# Patient Record
Sex: Female | Born: 2000
Health system: Southern US, Community
[De-identification: ages and names within clinical notes are randomized; demographics above are authoritative.]

## PROBLEM LIST (undated history)

## (undated) HISTORY — PX: MANDIBLE SURGERY: SHX707

---
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
## 2019-10-23 ENCOUNTER — Ambulatory Visit: Payer: BLUE CROSS/BLUE SHIELD | Admitting: Podiatry

## 2019-11-20 DIAGNOSIS — Z5321 Procedure and treatment not carried out due to patient leaving prior to being seen by health care provider: Secondary | ICD-10-CM | POA: Diagnosis not present

## 2019-11-20 DIAGNOSIS — Z041 Encounter for examination and observation following transport accident: Secondary | ICD-10-CM | POA: Diagnosis not present

## 2020-05-17 ENCOUNTER — Encounter (HOSPITAL_COMMUNITY): Payer: Self-pay | Admitting: Emergency Medicine

## 2020-05-17 ENCOUNTER — Other Ambulatory Visit: Payer: Self-pay

## 2020-05-17 ENCOUNTER — Emergency Department (HOSPITAL_COMMUNITY)
Admission: EM | Admit: 2020-05-17 | Discharge: 2020-05-18 | Disposition: A | Discharge status: Home / Self Care | Payer: 59 | Attending: Emergency Medicine | Admitting: Emergency Medicine

## 2020-05-17 DIAGNOSIS — R300 Dysuria: Secondary | ICD-10-CM | POA: Insufficient documentation

## 2020-05-17 DIAGNOSIS — Z5321 Procedure and treatment not carried out due to patient leaving prior to being seen by health care provider: Secondary | ICD-10-CM | POA: Insufficient documentation

## 2020-05-17 DIAGNOSIS — N939 Abnormal uterine and vaginal bleeding, unspecified: Secondary | ICD-10-CM | POA: Diagnosis not present

## 2020-05-17 LAB — CBC WITH DIFFERENTIAL/PLATELET
Abs Immature Granulocytes: 0.03 10*3/uL (ref 0.00–0.07)
Basophils Absolute: 0.1 10*3/uL (ref 0.0–0.1)
Basophils Relative: 1 %
Eosinophils Absolute: 0.1 10*3/uL (ref 0.0–0.5)
Eosinophils Relative: 2 %
HCT: 38.2 % (ref 36.0–46.0)
Hemoglobin: 13.5 g/dL (ref 12.0–15.0)
Immature Granulocytes: 0 %
Lymphocytes Relative: 32 %
Lymphs Abs: 2.7 10*3/uL (ref 0.7–4.0)
MCH: 34 pg (ref 26.0–34.0)
MCHC: 35.3 g/dL (ref 30.0–36.0)
MCV: 96.2 fL (ref 80.0–100.0)
Monocytes Absolute: 0.6 10*3/uL (ref 0.1–1.0)
Monocytes Relative: 7 %
Neutro Abs: 4.7 10*3/uL (ref 1.7–7.7)
Neutrophils Relative %: 58 %
Platelets: 219 10*3/uL (ref 150–400)
RBC: 3.97 MIL/uL (ref 3.87–5.11)
RDW: 12.1 % (ref 11.5–15.5)
WBC: 8.2 10*3/uL (ref 4.0–10.5)
nRBC: 0 % (ref 0.0–0.2)

## 2020-05-17 LAB — URINALYSIS, ROUTINE W REFLEX MICROSCOPIC
Bilirubin Urine: NEGATIVE
Glucose, UA: NEGATIVE mg/dL
Ketones, ur: NEGATIVE mg/dL
Nitrite: NEGATIVE
Protein, ur: 100 mg/dL — AB
RBC / HPF: >50 RBC/hpf — ABNORMAL HIGH (ref 0–5)
Specific Gravity, Urine: 1.03 (ref 1.005–1.030)
WBC, UA: >50 WBC/hpf — ABNORMAL HIGH (ref 0–5)
pH: 8 (ref 5.0–8.0)

## 2020-05-17 LAB — I-STAT BETA HCG BLOOD, ED (MC, WL, AP ONLY): I-stat hCG, quantitative: <5 m[IU]/mL (ref <5)

## 2020-05-17 LAB — BASIC METABOLIC PANEL
Anion gap: 11 (ref 5–15)
BUN: 8 mg/dL (ref 6–20)
CO2: 25 mmol/L (ref 22–32)
Calcium: 9.1 mg/dL (ref 8.9–10.3)
Chloride: 104 mmol/L (ref 98–111)
Creatinine, Ser: 0.84 mg/dL (ref 0.44–1.00)
GFR, Estimated: >60 mL/min (ref >60)
Glucose, Bld: 93 mg/dL (ref 70–99)
Potassium: 3.9 mmol/L (ref 3.5–5.1)
Sodium: 140 mmol/L (ref 135–145)

## 2020-05-17 NOTE — ED Triage Notes (Signed)
Patient reports dysuria with concentrated/malodorous urine for several days / vaginal bleeding today .

## 2020-05-18 NOTE — ED Notes (Signed)
Patient states her parents have to work in the morning so she has to leave

## 2020-06-14 NOTE — L&D Delivery Note (Signed)
OB/GYN Faculty Practice Delivery Note  Michelle Stewart is a 20 y.o. G1P0000 s/p SVD at [redacted]w[redacted]d. She was admitted for PROM.   ROM: 20h 68m with clear fluid GBS Status: Negative Maximum Maternal Temperature: 98.3  Labor Progress: Patient PROMed at home at 1600, then presented to MAU. Was in MAU for extended period of time due to L&D bed shortage. Once up on L&D her labor was induced with a foley bulb and cytotec and then ultimately when the foley bulb came out she received pitocin and progressed to complete  Delivery Date/Time: 1240 on 05/16/2021 Delivery: Called to room and patient was complete and pushing. Head delivered LOA. No nuchal cord present. Shoulder and body delivered in usual fashion. Infant with spontaneous cry, placed on mother's abdomen, dried and stimulated. Cord clamped x 2 after 1-minute delay, and cut by father of baby. Cord blood drawn. Placenta delivered spontaneously with gentle cord traction. Fundus firm with massage and Pitocin. Labia, perineum, vagina, and cervix inspected and found to have bilateral periurethral lacerations that were repaired with 4-0 Monocryl.  Placenta: Intact, 3V cord, L&D Complications: None Lacerations: bilateral periurethral EBL: 50cc Analgesia: Epidural   Infant: female  APGARs 9,9  weight pending  Warner Mccreedy, MD, MPH OB Fellow, Faculty [facility] Center for [facility], [facility] 05/16/2021, 1:09 PM

## 2020-06-20 ENCOUNTER — Encounter (HOSPITAL_COMMUNITY): Payer: Self-pay | Admitting: Emergency Medicine

## 2020-06-20 ENCOUNTER — Ambulatory Visit (HOSPITAL_COMMUNITY)
Admission: EM | Admit: 2020-06-20 | Discharge: 2020-06-20 | Disposition: A | Discharge status: Home / Self Care | Payer: BC Managed Care – PPO | Attending: Physician Assistant | Admitting: Physician Assistant

## 2020-06-20 ENCOUNTER — Other Ambulatory Visit: Payer: Self-pay

## 2020-06-20 DIAGNOSIS — J069 Acute upper respiratory infection, unspecified: Secondary | ICD-10-CM

## 2020-06-20 DIAGNOSIS — U071 COVID-19: Secondary | ICD-10-CM | POA: Insufficient documentation

## 2020-06-20 LAB — SARS CORONAVIRUS 2 (TAT 6-24 HRS): SARS Coronavirus 2: POSITIVE — AB

## 2020-06-20 NOTE — ED Provider Notes (Signed)
[facility]    CSN: [id number] Arrival date & time: 06/20/20  0240      History   Chief Complaint Chief Complaint  Patient presents with  . Covid Exposure  . Cough  . Nasal Congestion  . Headache    HPI Michelle Stewart is a 20 y.o. female.   The history is provided by the patient. No language interpreter was used.  Cough Cough characteristics:  Non-productive Sputum characteristics:  Nondescript Severity:  Moderate Timing:  Constant Progression:  Worsening Chronicity:  New Associated symptoms: headaches   Headache Associated symptoms: cough     History reviewed. No pertinent past medical history.  Patient Active Problem List   Diagnosis Date Noted  . Adjustment disorder with mixed disturbance of emotions and conduct 04/20/2018    History reviewed. No pertinent surgical history.  OB History   No obstetric history on file.      Home Medications    Prior to Admission medications   Medication Sig Start Date End Date Taking? Authorizing Provider  medroxyPROGESTERone (DEPO-PROVERA) 150 MG/ML injection Inject 150 mg into the muscle every 3 (three) months.    [provider]    Family History History reviewed. No pertinent family history.  Social History Social History   Tobacco Use  . Smoking status: Never Smoker  . Smokeless tobacco: Never Used  Substance Use Topics  . Alcohol use: Never  . Drug use: Never     Allergies   Albolene and Lactose   Review of Systems Review of Systems  Respiratory: Positive for cough.   Neurological: Positive for headaches.  All other systems reviewed and are negative.    Physical Exam Triage Vital Signs ED Triage Vitals  Enc Vitals Group     BP 06/20/20 0842 109/75     Pulse Rate 06/20/20 0842 81     Resp 06/20/20 0842 17     Temp 06/20/20 0842 97.6 F (36.4 C)     Temp Source 06/20/20 0842 Oral     SpO2 06/20/20 0842 100 %     Weight --      Height --      Head Circumference --       Peak Flow --      Pain Score 06/20/20 0839 0     Pain Loc --      Pain Edu? --      Excl. in GC? --    No data found.  Updated Vital Signs BP 109/75 (BP Location: Left Arm)   Pulse 81   Temp 97.6 F (36.4 C) (Oral)   Resp 17   SpO2 100%   Visual Acuity Right Eye Distance:   Left Eye Distance:   Bilateral Distance:    Right Eye Near:   Left Eye Near:    Bilateral Near:     Physical Exam Vitals and nursing note reviewed.  Constitutional:      Appearance: She is well-developed and well-nourished.  HENT:     Head: Normocephalic.  Eyes:     Extraocular Movements: EOM normal.  Pulmonary:     Effort: Pulmonary effort is normal.  Abdominal:     General: There is no distension.  Musculoskeletal:        General: Normal range of motion.     Cervical back: Normal range of motion.  Neurological:     Mental Status: She is alert and oriented to person, place, and time.  Psychiatric:        Mood  and Affect: Mood and affect and mood normal.      UC Treatments / Results  Labs (all labs ordered are listed, but only abnormal results are displayed) Labs Reviewed  SARS CORONAVIRUS 2 (TAT 6-24 HRS)    EKG   Radiology No results found.  Procedures Procedures (including critical care time)  Medications Ordered in UC Medications - No data to display  Initial Impression / Assessment and Plan / UC Course  I have reviewed the triage vital signs and the nursing notes.  Pertinent labs & imaging results that were available during my care of the patient were reviewed by me and considered in my medical decision making (see chart for details).     Covid pending. Final Clinical Impressions(s) / UC Diagnoses   Final diagnoses:  Upper respiratory tract infection, unspecified type     Discharge Instructions     Return if any problems.    ED Prescriptions    None     PDMP not reviewed this encounter.  An After Visit Summary was printed and given to the  patient.    Elson Areas, [place] 06/20/20 1147

## 2020-06-20 NOTE — Discharge Instructions (Signed)
Return if any problems.

## 2020-07-14 ENCOUNTER — Ambulatory Visit: Payer: 59 | Admitting: Podiatry

## 2020-07-28 ENCOUNTER — Ambulatory Visit: Payer: BC Managed Care – PPO | Admitting: Podiatry

## 2020-09-06 ENCOUNTER — Inpatient Hospital Stay (HOSPITAL_COMMUNITY)
Admission: EM | Admit: 2020-09-06 | Discharge: 2020-09-07 | Disposition: A | Discharge status: Home / Self Care | Payer: BC Managed Care – PPO | Attending: Obstetrics and Gynecology | Admitting: Obstetrics and Gynecology

## 2020-09-06 DIAGNOSIS — R109 Unspecified abdominal pain: Secondary | ICD-10-CM | POA: Insufficient documentation

## 2020-09-06 DIAGNOSIS — O26899 Other specified pregnancy related conditions, unspecified trimester: Secondary | ICD-10-CM

## 2020-09-06 DIAGNOSIS — O26891 Other specified pregnancy related conditions, first trimester: Secondary | ICD-10-CM | POA: Insufficient documentation

## 2020-09-06 DIAGNOSIS — Z3A01 Less than 8 weeks gestation of pregnancy: Secondary | ICD-10-CM

## 2020-09-06 DIAGNOSIS — O3680X0 Pregnancy with inconclusive fetal viability, not applicable or unspecified: Secondary | ICD-10-CM

## 2020-09-07 ENCOUNTER — Inpatient Hospital Stay (HOSPITAL_COMMUNITY): Payer: BC Managed Care – PPO

## 2020-09-07 ENCOUNTER — Encounter (HOSPITAL_COMMUNITY): Payer: Self-pay

## 2020-09-07 ENCOUNTER — Other Ambulatory Visit: Payer: Self-pay

## 2020-09-07 DIAGNOSIS — Z3201 Encounter for pregnancy test, result positive: Secondary | ICD-10-CM | POA: Diagnosis not present

## 2020-09-07 DIAGNOSIS — Z3A00 Weeks of gestation of pregnancy not specified: Secondary | ICD-10-CM | POA: Diagnosis not present

## 2020-09-07 DIAGNOSIS — N858 Other specified noninflammatory disorders of uterus: Secondary | ICD-10-CM | POA: Diagnosis not present

## 2020-09-07 DIAGNOSIS — O26891 Other specified pregnancy related conditions, first trimester: Secondary | ICD-10-CM | POA: Diagnosis not present

## 2020-09-07 DIAGNOSIS — R109 Unspecified abdominal pain: Secondary | ICD-10-CM | POA: Diagnosis not present

## 2020-09-07 DIAGNOSIS — Z3A01 Less than 8 weeks gestation of pregnancy: Secondary | ICD-10-CM | POA: Diagnosis not present

## 2020-09-07 LAB — COMPREHENSIVE METABOLIC PANEL
ALT: 17 U/L (ref 0–44)
AST: 22 U/L (ref 15–41)
Albumin: 4.2 g/dL (ref 3.5–5.0)
Alkaline Phosphatase: 54 U/L (ref 38–126)
Anion gap: 4 — ABNORMAL LOW (ref 5–15)
BUN: 16 mg/dL (ref 6–20)
CO2: 26 mmol/L (ref 22–32)
Calcium: 9.3 mg/dL (ref 8.9–10.3)
Chloride: 105 mmol/L (ref 98–111)
Creatinine, Ser: 0.75 mg/dL (ref 0.44–1.00)
GFR, Estimated: >60 mL/min (ref >60)
Glucose, Bld: 86 mg/dL (ref 70–99)
Potassium: 3.8 mmol/L (ref 3.5–5.1)
Sodium: 135 mmol/L (ref 135–145)
Total Bilirubin: 0.8 mg/dL (ref 0.3–1.2)
Total Protein: 6.9 g/dL (ref 6.5–8.1)

## 2020-09-07 LAB — WET PREP, GENITAL
Sperm: NONE SEEN
Trich, Wet Prep: NONE SEEN
Yeast Wet Prep HPF POC: NONE SEEN

## 2020-09-07 LAB — CBC WITH DIFFERENTIAL/PLATELET
Abs Immature Granulocytes: 0.03 10*3/uL (ref 0.00–0.07)
Basophils Absolute: 0.1 10*3/uL (ref 0.0–0.1)
Basophils Relative: 1 %
Eosinophils Absolute: 0.2 10*3/uL (ref 0.0–0.5)
Eosinophils Relative: 2 %
HCT: 37.9 % (ref 36.0–46.0)
Hemoglobin: 14 g/dL (ref 12.0–15.0)
Immature Granulocytes: 0 %
Lymphocytes Relative: 30 %
Lymphs Abs: 3.3 10*3/uL (ref 0.7–4.0)
MCH: 34.7 pg — ABNORMAL HIGH (ref 26.0–34.0)
MCHC: 36.9 g/dL — ABNORMAL HIGH (ref 30.0–36.0)
MCV: 93.8 fL (ref 80.0–100.0)
Monocytes Absolute: 0.8 10*3/uL (ref 0.1–1.0)
Monocytes Relative: 7 %
Neutro Abs: 6.5 10*3/uL (ref 1.7–7.7)
Neutrophils Relative %: 60 %
Platelets: 238 10*3/uL (ref 150–400)
RBC: 4.04 MIL/uL (ref 3.87–5.11)
RDW: 11.9 % (ref 11.5–15.5)
WBC: 10.9 10*3/uL — ABNORMAL HIGH (ref 4.0–10.5)
nRBC: 0 % (ref 0.0–0.2)

## 2020-09-07 LAB — URINALYSIS, ROUTINE W REFLEX MICROSCOPIC
Bacteria, UA: NONE SEEN
Bilirubin Urine: NEGATIVE
Glucose, UA: NEGATIVE mg/dL
Hgb urine dipstick: NEGATIVE
Ketones, ur: NEGATIVE mg/dL
Nitrite: NEGATIVE
Protein, ur: NEGATIVE mg/dL
Specific Gravity, Urine: 1.02 (ref 1.005–1.030)
pH: 5 (ref 5.0–8.0)

## 2020-09-07 LAB — I-STAT BETA HCG BLOOD, ED (MC, WL, AP ONLY): I-stat hCG, quantitative: 82.6 m[IU]/mL — ABNORMAL HIGH (ref <5)

## 2020-09-07 LAB — ABO/RH: ABO/RH(D): A POS

## 2020-09-07 LAB — POC URINE PREG, ED: Preg Test, Ur: POSITIVE — AB

## 2020-09-07 LAB — HCG, QUANTITATIVE, PREGNANCY: hCG, Beta Chain, Quant, S: 112 m[IU]/mL — ABNORMAL HIGH (ref <5)

## 2020-09-07 MED ORDER — ACETAMINOPHEN 500 MG PO TABS
1000.0000 mg | ORAL_TABLET | Freq: Once | ORAL | Status: AC
  Administered 2020-09-07: 1000 mg via ORAL
  Filled 2020-09-07: qty 2

## 2020-09-07 NOTE — MAU Provider Note (Signed)
History     CSN: [id number]  Arrival date and time: 09/06/20 2352   Event Date/Time   First Provider Initiated Contact with Patient 09/07/20 0208      Chief Complaint  Patient presents with  . Possible Pregnancy  . Abdominal Cramping   HPI  Ms.Michelle Stewart is a 20 y.o. feamle G1P0 @ [redacted]w[redacted]d here in MAU with complaints of lower abdominal cramping that started yesterday.  The pain has not gotten worse. The pain is located in the bottom of her abdomen on both sides. The pain is constant. The pain is like period cramps. She has not tried anything for the pain. No bleeding.   OB History    Gravida  1   Para      Term      Preterm      AB      Living        SAB      IAB      Ectopic      Multiple      Live Births              History reviewed. No pertinent past medical history.  Past Surgical History:  Procedure Laterality Date  . MANDIBLE SURGERY      History reviewed. No pertinent family history.  Social History   Tobacco Use  . Smoking status: Never Smoker  . Smokeless tobacco: Never Used  Vaping Use  . Vaping Use: Never used  Substance Use Topics  . Alcohol use: Never  . Drug use: Never    Allergies:  Allergies  Allergen Reactions  . Albolene Swelling  . Lactose Diarrhea and Other (See Comments)    constipation     Medications Prior to Admission  Medication Sig Dispense Refill Last Dose  . medroxyPROGESTERone (DEPO-PROVERA) 150 MG/ML injection Inject 150 mg into the muscle every 3 (three) months.      Results for orders placed or performed during the hospital encounter of 09/06/20 (from the past 48 hour(s))  POC Urine Pregnancy, ED (not at [facility])     Status: Abnormal   Collection Time: 09/07/20 12:20 AM  Result Value Ref Range   Preg Test, Ur POSITIVE (A) NEGATIVE    Comment:        THE SENSITIVITY OF THIS METHODOLOGY IS >24 mIU/mL   hCG, quantitative, pregnancy     Status: Abnormal   Collection Time: 09/07/20 12:24 AM  Result  Value Ref Range   hCG, Beta Chain, Quant, S 112 (H) <5 mIU/mL    Comment:          GEST. AGE      CONC.  (mIU/mL)   <=1 WEEK        5 - 50     2 WEEKS       50 - 500     3 WEEKS       100 - 10,000     4 WEEKS     1,000 - 30,000     5 WEEKS     3,500 - 115,000   6-8 WEEKS     12,000 - 270,000    12 WEEKS     15,000 - 220,000        FEMALE AND NON-PREGNANT FEMALE:     LESS THAN 5 mIU/mL Performed at [facility], [address], [place], [place] [address]   I-Stat beta hCG blood, ED ([facility], [facility], [facility] only)     Status: Abnormal  Collection Time: 09/07/20 12:42 AM  Result Value Ref Range   I-stat hCG, quantitative 82.6 (H) <5 mIU/mL   Comment 3            Comment:   GEST. AGE      CONC.  (mIU/mL)   <=1 WEEK        5 - 50     2 WEEKS       50 - 500     3 WEEKS       100 - 10,000     4 WEEKS     1,000 - 30,000        FEMALE AND NON-PREGNANT FEMALE:     LESS THAN 5 mIU/mL   CBC with Differential/Platelet     Status: Abnormal   Collection Time: 09/07/20  1:27 AM  Result Value Ref Range   WBC 10.9 (H) 4.0 - 10.5 K/uL   RBC 4.04 3.87 - 5.11 MIL/uL   Hemoglobin 14.0 12.0 - 15.0 g/dL   HCT 27.7 82.4 - 23.5 %   MCV 93.8 80.0 - 100.0 fL   MCH 34.7 (H) 26.0 - 34.0 pg   MCHC 36.9 (H) 30.0 - 36.0 g/dL   RDW 36.1 44.3 - 15.4 %   Platelets 238 150 - 400 K/uL   nRBC 0.0 0.0 - 0.2 %   Neutrophils Relative % 60 %   Neutro Abs 6.5 1.7 - 7.7 K/uL   Lymphocytes Relative 30 %   Lymphs Abs 3.3 0.7 - 4.0 K/uL   Monocytes Relative 7 %   Monocytes Absolute 0.8 0.1 - 1.0 K/uL   Eosinophils Relative 2 %   Eosinophils Absolute 0.2 0.0 - 0.5 K/uL   Basophils Relative 1 %   Basophils Absolute 0.1 0.0 - 0.1 K/uL   Immature Granulocytes 0 %   Abs Immature Granulocytes 0.03 0.00 - 0.07 K/uL    Comment: Performed at [facility], [address] [place], [place] [address]  Comprehensive metabolic panel     Status: Abnormal   Collection Time: 09/07/20  1:27 AM  Result Value Ref Range   Sodium  135 135 - 145 mmol/L   Potassium 3.8 3.5 - 5.1 mmol/L   Chloride 105 98 - 111 mmol/L   CO2 26 22 - 32 mmol/L   Glucose, Bld 86 70 - 99 mg/dL    Comment: Glucose reference range applies only to samples taken after fasting for at least 8 hours.   BUN 16 6 - 20 mg/dL   Creatinine, Ser 6.19 0.44 - 1.00 mg/dL   Calcium 9.3 8.9 - 50.9 mg/dL   Total Protein 6.9 6.5 - 8.1 g/dL   Albumin 4.2 3.5 - 5.0 g/dL   AST 22 15 - 41 U/L   ALT 17 0 - 44 U/L   Alkaline Phosphatase 54 38 - 126 U/L   Total Bilirubin 0.8 0.3 - 1.2 mg/dL   GFR, Estimated >32 >67 mL/min    Comment: (NOTE) Calculated using the CKD-EPI Creatinine Equation (2021)    Anion gap 4 (L) 5 - 15    Comment: Performed at [facility], [address] [place], [place] [address]  ABO/Rh     Status: None   Collection Time: 09/07/20  1:27 AM  Result Value Ref Range   ABO/RH(D) A POS    No rh immune globuloin      NOT A RH IMMUNE GLOBULIN CANDIDATE, PT RH POSITIVE Performed at [facility], [address] [place], [place] [address]   Urinalysis, Routine w reflex microscopic Urine, Clean Catch     Status: Abnormal   Collection Time: 09/07/20  1:42 AM  Result Value Ref Range   Color, Urine YELLOW YELLOW   APPearance CLEAR CLEAR   Specific Gravity, Urine 1.020 1.005 - 1.030   pH 5.0 5.0 - 8.0   Glucose, UA NEGATIVE NEGATIVE mg/dL   Hgb urine dipstick NEGATIVE NEGATIVE   Bilirubin Urine NEGATIVE NEGATIVE   Ketones, ur NEGATIVE NEGATIVE mg/dL   Protein, ur NEGATIVE NEGATIVE mg/dL   Nitrite NEGATIVE NEGATIVE   Leukocytes,Ua TRACE (A) NEGATIVE   WBC, UA 0-5 0 - 5 WBC/hpf   Bacteria, UA NONE SEEN NONE SEEN   Squamous Epithelial / LPF 0-5 0 - 5    Comment: Performed at [facility], [address] [place], [place] [address]    US OB LESS THAN 14 WEEKS WITH OB TRANSVAGINAL  Result Date: 09/07/2020 CLINICAL DATA:  Pelvic cramping and positive pregnancy test EXAM: OBSTETRIC <14 WK [place] AND TRANSVAGINAL OB US  TECHNIQUE: Both transabdominal and transvaginal ultrasound examinations were performed for complete evaluation of the gestation as well as the maternal uterus, adnexal regions, and pelvic cul-de-sac. Transvaginal technique was performed to assess early pregnancy. COMPARISON:  None. FINDINGS: Intrauterine gestational sac: Absent Maternal uterus/adnexae: Mild decidual reaction is noted within the uterus. No gestational sac is seen. Ovaries are within normal limits. Mild free fluid is seen in the pelvis IMPRESSION: Mild free fluid. No evidence of intra or extrauterine gestational sac. Follow-up can be performed as clinically indicated. Electronically Signed   By: Alcide Clever M.D.   On: 09/07/2020 02:18   Review of Systems  Gastrointestinal: Positive for abdominal pain and nausea. Negative for vomiting.  Genitourinary: Negative for dysuria and vaginal bleeding.   Physical Exam   Blood pressure 104/68, pulse 84, temperature 98 F (36.7 C), resp. rate 16, height 5\' 1"  (1.549 m), weight 51.3 kg, last menstrual period 08/02/2020, SpO2 100 %.  Physical Exam Constitutional:      General: She is not in acute distress.    Appearance: Normal appearance. She is not ill-appearing, toxic-appearing or diaphoretic.  HENT:     Head: Normocephalic.  Abdominal:     Tenderness: There is no abdominal tenderness.  Neurological:     Mental Status: She is alert and oriented to person, place, and time.  Psychiatric:        Behavior: Behavior normal.    MAU Course  Procedures  None  MDM  Wet prep & GC HIV, CBC, Hcg, ABO 08/04/2020 OB transvaginal  Wet prep shows clue cells; patient without odor or symptoms. Will not treat at this time.   Assessment and Plan   A:  1. Abdominal pain in pregnancy   2. Pregnancy of unknown anatomic location   3. [redacted] weeks gestation of pregnancy     P:  Discharge home in stable condition  F/u 3/29 @ 1:30 PM at [facility] for follow up quant Ectopic precautions Pelvic rest Return  to MAU if symptoms worsen  Adalene Gulotta, [facility] NP 09/09/2020 8:14 AM

## 2020-09-07 NOTE — ED Provider Notes (Signed)
MSE was initiated and I personally evaluated the patient and placed orders (if any) at  12:43 AM on September 07, 2020.  The patient appears stable so that the remainder of the MSE may be completed by another provider.  Patient presents to the emergency department with lower abdominal cramping pain in the setting of likely early pregnancy.  She has an LMP of February 19 th and has tested positive at home for pregnancy.  Her pregnancy test here is also positive.  Her abdominal pain is cramping and diffusely through her lower abdomen.  No vaginal bleeding or discharge.  She is hemodynamically stable.  This is her first pregnancy.   Discussed the case with the MAU APP on call. They will accept the patient there for evaluation.    Maia Plan, MD 09/07/20 [phone]

## 2020-09-07 NOTE — MAU Note (Signed)
Pt reports lower abdominal cramping that started yesterday.   Denies vaginal bleeding.

## 2020-09-07 NOTE — ED Triage Notes (Signed)
Pt reports that she is 5-[redacted] weeks pregnant. Getting lower abdominal cramping. Denies vaginal bleeding. Cramping started yesterday.

## 2020-09-08 LAB — GC/CHLAMYDIA PROBE AMP (~~LOC~~) NOT AT ARMC
Chlamydia: NEGATIVE
Comment: NEGATIVE
Comment: NORMAL
Neisseria Gonorrhea: NEGATIVE

## 2020-09-09 ENCOUNTER — Other Ambulatory Visit: Payer: BC Managed Care – PPO

## 2020-09-09 ENCOUNTER — Other Ambulatory Visit: Payer: Self-pay

## 2020-09-09 ENCOUNTER — Ambulatory Visit (INDEPENDENT_AMBULATORY_CARE_PROVIDER_SITE_OTHER): Payer: BC Managed Care – PPO | Admitting: Lactation Services

## 2020-09-09 ENCOUNTER — Telehealth: Payer: Self-pay | Admitting: Lactation Services

## 2020-09-09 VITALS — BP 114/74 | HR 94 | Ht 61.0 in | Wt 112.2 lb

## 2020-09-09 DIAGNOSIS — Z3A01 Less than 8 weeks gestation of pregnancy: Secondary | ICD-10-CM | POA: Diagnosis not present

## 2020-09-09 LAB — BETA HCG QUANT (REF LAB): hCG Quant: 451 m[IU]/mL

## 2020-09-09 NOTE — Progress Notes (Addendum)
Patient her for follow up Stat Beta Hcg. LMP 08/02/2020 EDD 05/09/2021. Patient is 5 weeks 3 days.   Patient reports abdominal cramping, centrally located. She is not having any bleeding or abnormal discharge. She reports she is feeling stressed.   She is taking PNV.   Patient was advised that her Hcg would be back in about 2 hours and she will be called with results and recommendations. Patient voiced understanding.   Follow up Hcg 451, reviewed with Dr. Crissie Reese. Patient can start [facility].   Called patient at 3:39 pm. She did not answer. LM for her to call the office at her convenience.

## 2020-09-09 NOTE — Telephone Encounter (Signed)
Called patient and informed her of adequate rise to Hcg and can start [facility]. Reviewed front desk will be calling to schedule her for [facility] at around 10 weeks of pregnancy.   Patient was informed if she has bleeding like a period or severe abdominal pain to go back to MAU for evaluation. Patient voiced understanding.   Patient to call the office with any questions or concerns as needed.

## 2020-09-09 NOTE — Telephone Encounter (Signed)
Patient called and LM for office to call her back.   Returned patients call and she did not answer x 2. LM for her to call the office at her convenience.   Patient needs to be notified that her Hcg rose appropriately to 451 and can start [facility]. Message to front office to call patient and schedule for [facility].

## 2020-09-09 NOTE — Telephone Encounter (Signed)
-----   Message from Ed Blalock, RN sent at 09/09/2020  3:42 PM EDT ----- [employer]- See check out note please and schedule for [facility].

## 2020-09-11 NOTE — Progress Notes (Signed)
Chart reviewed for nurse visit. Agree with plan of care.   Venora Maples, MD 09/11/20 4:52 PM

## 2020-09-13 ENCOUNTER — Inpatient Hospital Stay (HOSPITAL_COMMUNITY)
Admission: AD | Admit: 2020-09-13 | Discharge: 2020-09-13 | Disposition: A | Discharge status: Home / Self Care | Payer: BC Managed Care – PPO | Attending: Obstetrics and Gynecology | Admitting: Obstetrics and Gynecology

## 2020-09-13 ENCOUNTER — Other Ambulatory Visit: Payer: Self-pay

## 2020-09-13 DIAGNOSIS — R1032 Left lower quadrant pain: Secondary | ICD-10-CM | POA: Insufficient documentation

## 2020-09-13 DIAGNOSIS — R1031 Right lower quadrant pain: Secondary | ICD-10-CM | POA: Insufficient documentation

## 2020-09-13 DIAGNOSIS — O26891 Other specified pregnancy related conditions, first trimester: Secondary | ICD-10-CM | POA: Insufficient documentation

## 2020-09-13 DIAGNOSIS — O23591 Infection of other part of genital tract in pregnancy, first trimester: Secondary | ICD-10-CM | POA: Insufficient documentation

## 2020-09-13 DIAGNOSIS — N760 Acute vaginitis: Secondary | ICD-10-CM

## 2020-09-13 DIAGNOSIS — O99891 Other specified diseases and conditions complicating pregnancy: Secondary | ICD-10-CM

## 2020-09-13 DIAGNOSIS — Z3A01 Less than 8 weeks gestation of pregnancy: Secondary | ICD-10-CM | POA: Insufficient documentation

## 2020-09-13 DIAGNOSIS — Z3A00 Weeks of gestation of pregnancy not specified: Secondary | ICD-10-CM | POA: Diagnosis not present

## 2020-09-13 DIAGNOSIS — R1030 Lower abdominal pain, unspecified: Secondary | ICD-10-CM | POA: Diagnosis not present

## 2020-09-13 DIAGNOSIS — B9689 Other specified bacterial agents as the cause of diseases classified elsewhere: Secondary | ICD-10-CM | POA: Insufficient documentation

## 2020-09-13 DIAGNOSIS — Z3687 Encounter for antenatal screening for uncertain dates: Secondary | ICD-10-CM

## 2020-09-13 LAB — HCG, QUANTITATIVE, PREGNANCY: hCG, Beta Chain, Quant, S: 5779 m[IU]/mL — ABNORMAL HIGH (ref <5)

## 2020-09-13 MED ORDER — METRONIDAZOLE 500 MG PO TABS: 500.0000 mg | ORAL_TABLET | Freq: Once | ORAL | Status: DC

## 2020-09-13 NOTE — Discharge Instructions (Signed)
Abdominal Pain During Pregnancy Abdominal pain is common during pregnancy and has many possible causes. Some causes are more serious than others, and sometimes the cause is not known. Abdominal pain can be a sign that labor is starting. It can also be caused by normal growth of your baby causing stretching of muscles and ligaments during pregnancy. Always tell your health care provider if you have any abdominal pain. Follow these instructions at home:  Do not have sex or put anything in your vagina until your pain goes away completely.  Get plenty of rest until your pain improves.  Drink enough fluid to keep your urine pale yellow.  Take over-the-counter and prescription medicines only as told by your health care provider.  Keep all follow-up visits. This is important.   Contact a health care provider if:  Your pain continues or gets worse after resting.  You have lower abdominal pain that: ? Comes and goes at regular intervals. ? Spreads to your back. ? Is similar to menstrual cramps.  You have pain or burning when you urinate. Get help right away if:  You have a fever, chills, or shortness of breath.  You have vaginal bleeding.  You are leaking fluid or passing tissue from your vagina.  You have vomiting or diarrhea that lasts for more than 24 hours.  Your baby is moving less than usual.  You feel very weak or faint.  You develop severe pain in your upper abdomen. Summary  Abdominal pain is common during pregnancy and has many possible causes.  If you experience abdominal pain during pregnancy, tell your health care provider right away.  Follow your health care provider's home care instructions and keep all follow-up visits as told. This information is not intended to replace advice given to you by your health care provider. Make sure you discuss any questions you have with your health care provider. Document Revised: 02/12/2020 Document Reviewed: 02/12/2020 Elsevier  Patient Education  2021 [employer] Inc.  

## 2020-09-13 NOTE — ED Provider Notes (Signed)
MSE was initiated and I personally evaluated the patient and placed orders (if any) at  9:45 PM on September 13, 2020.  The patient appears stable so that the remainder of the MSE may be completed by another provider.  20 year old female G1 P0 approximately [redacted] weeks pregnant presenting complaining of lower abdominal cramping that is been waxing waning for more than a week and forth concerning for potential miscarriage.  Denies any vaginal bleeding or vaginal discharge.  States she has had a formal ultrasound 4 days ago outpatient but confirm IUP.  At this time she is resting comfortably, minimal tenderness to lower abdomen on palpation.  9:49 PM Appreciate consultation with MAU provider who agrees to accept pt for further evaluation.     Fayrene Helper, PA-C 09/13/20 2149    Koleen Distance, MD 09/14/20 [phone]

## 2020-09-13 NOTE — MAU Provider Note (Addendum)
Event Date/Time   First Provider Initiated Contact with Patient 09/13/20 2226      S Ms. Sable Knoles is a 20 y.o. G1P0 patient who presents to MAU today with complaint of abdominal pain. Patient states "it is just a lot of stuff going on and I just want to make sure I am not miscarrying from stress."  Patient denies vaginal bleeding or discharge.  She states the pain is located in lower abdomen and is "in both sides, but mostly my right."  She describes the pain as "a period cramp." She reports the pain is constant and she has not tried any interventions for pain.  She states there are no aggravating or relieving factors.  She endorses that this pain has not changed since her last visit on March 27th.   O BP 124/74 (BP Location: Right Arm)   Pulse 98   Temp 98.9 F (37.2 C)   Resp 17   Ht 5\' 2"  (1.575 m)   Wt 50.9 kg   LMP 08/02/2020   SpO2 100%   BMI 20.52 kg/m   Review of Systems  Constitutional: Negative for chills and fever.  Respiratory: Negative for cough and shortness of breath.   Gastrointestinal: Positive for abdominal pain (Bilaterally-Lower-Cramping), constipation and nausea. Negative for vomiting.  Genitourinary:       No vaginal bleeding or discharge    Physical Exam Vitals reviewed.  Constitutional:      Appearance: She is well-developed.  HENT:     Head: Normocephalic and atraumatic.  Pulmonary:     Effort: Pulmonary effort is normal. No respiratory distress.     Breath sounds: Normal breath sounds.  Abdominal:     General: Abdomen is flat. Bowel sounds are normal.     Palpations: Abdomen is soft.     Tenderness: There is no abdominal tenderness.  Skin:    General: Skin is warm and dry.  Neurological:     Mental Status: She is alert and oriented to person, place, and time.  Psychiatric:        Mood and Affect: Mood is anxious.        Behavior: Behavior normal.    Results for orders placed or performed during the hospital encounter of 09/13/20 (from  the past 24 hour(s))  hCG, quantitative, pregnancy     Status: Abnormal   Collection Time: 09/13/20  9:49 PM  Result Value Ref Range   hCG, Beta Chain, Quant, S 5,779 (H) <5 mIU/mL   11/13/20 OB LESS THAN 14 WEEKS WITH OB TRANSVAGINAL  Result Date: 09/07/2020 CLINICAL DATA:  Pelvic cramping and positive pregnancy test EXAM: OBSTETRIC <14 WK 09/09/2020 AND TRANSVAGINAL OB US TECHNIQUE: Both transabdominal and transvaginal ultrasound examinations were performed for complete evaluation of the gestation as well as the maternal uterus, adnexal regions, and pelvic cul-de-sac. Transvaginal technique was performed to assess early pregnancy. COMPARISON:  None. FINDINGS: Intrauterine gestational sac: Absent Maternal uterus/adnexae: Mild decidual reaction is noted within the uterus. No gestational sac is seen. Ovaries are within normal limits. Mild free fluid is seen in the pelvis IMPRESSION: Mild free fluid. No evidence of intra or extrauterine gestational sac. Follow-up can be performed as clinically indicated. Electronically Signed   By: [place] M.D.   On: 09/07/2020 02:18   A Medical screening exam complete Abdominal Pain-No Change +Clue Cells  P -Informed that hCG is pending and she can wait for results and if appropriate will send for 09/09/2020. -Reassured that in absence of vaginal  bleeding or change in abdominal cramping, everything is likely okay. -Informed that she can receive outpatient [place] for reassurance if desired. -Patient agreeable to discharge and plan to scheduled for outpatient [place] if hCG level is appropriate. -Discussed usage of tylenol for abdominal cramping and discussed round ligament pain/discomfort associated with growing uterus. -Recommended treatment of BV, which patient endorses a history of, to rule out as cause of cramping.  Patient agreeable.  -Rx for metronidazole tablets sent to pharmacy on file.  -Patient encouraged to seek out LCSW at [facility] for support with social issues that are causing  stress. -Bleeding Precautions Given. -Encouraged to call or return to MAU if symptoms worsen or with the onset of new symptoms. -Discharged to home in stable condition.  Gerrit Heck, CNM 09/13/2020 10:26 PM

## 2020-09-13 NOTE — MAU Note (Signed)
Pt reports continued vaginal spotting since last week.  Pt wanted to make sure that the baby is okay.

## 2020-09-26 ENCOUNTER — Encounter (HOSPITAL_COMMUNITY): Payer: Self-pay | Admitting: Obstetrics & Gynecology

## 2020-09-26 ENCOUNTER — Inpatient Hospital Stay (HOSPITAL_COMMUNITY)
Admission: AD | Admit: 2020-09-26 | Discharge: 2020-09-27 | Disposition: A | Discharge status: Home / Self Care | Payer: BC Managed Care – PPO | Attending: Obstetrics & Gynecology | Admitting: Obstetrics & Gynecology

## 2020-09-26 ENCOUNTER — Other Ambulatory Visit: Payer: Self-pay

## 2020-09-26 DIAGNOSIS — O26851 Spotting complicating pregnancy, first trimester: Secondary | ICD-10-CM | POA: Insufficient documentation

## 2020-09-26 DIAGNOSIS — O23591 Infection of other part of genital tract in pregnancy, first trimester: Secondary | ICD-10-CM | POA: Insufficient documentation

## 2020-09-26 DIAGNOSIS — O23599 Infection of other part of genital tract in pregnancy, unspecified trimester: Secondary | ICD-10-CM | POA: Diagnosis not present

## 2020-09-26 DIAGNOSIS — Z3A00 Weeks of gestation of pregnancy not specified: Secondary | ICD-10-CM

## 2020-09-26 DIAGNOSIS — B9689 Other specified bacterial agents as the cause of diseases classified elsewhere: Secondary | ICD-10-CM | POA: Diagnosis not present

## 2020-09-26 DIAGNOSIS — Z3A01 Less than 8 weeks gestation of pregnancy: Secondary | ICD-10-CM | POA: Insufficient documentation

## 2020-09-26 MED ORDER — METRONIDAZOLE 0.75 % VA GEL: 1.0000 | Freq: Every day | VAGINAL | 0 refills | Status: DC

## 2020-09-26 NOTE — Discharge Instructions (Signed)
Metronidazole vaginal gel What is this medicine? METRONIDAZOLE (me troe NI da zole) VAGINAL GEL is an antiinfective. It is used to treat bacterial vaginitis. This medicine may be used for other purposes; ask your health care provider or pharmacist if you have questions. COMMON BRAND NAME(S): MetroGel, MetroGel Vaginal, MetroGel-Vaginal, NUVESSA, Vandazole What should I tell my health care provider before I take this medicine? They need to know if you have any of these conditions:  Cockayne syndrome  history of blood diseases, like sickle cell disease or leukemia  history of yeast infection  if you often drink alcohol  liver disease  an unusual or allergic reaction to metronidazole, nitroimidazoles, parabens, or other medicines, foods, dyes, or preservatives  pregnant or trying to get pregnant  breast-feeding How should I use this medicine? This medicine is only for use in the vagina. Do not take by mouth or apply to other areas of the body. Follow the directions on the prescription label. Wash hands before and after use. Screw the applicator to the tube and squeeze the tube gently to fill the applicator. Lie on your back, part and bend your knees. Insert the applicator tip high in the vagina and push the plunger to release the gel into the vagina. Gently remove the applicator. Wash the applicator well with warm water and soap. Use at regular intervals. Finish the full course prescribed by your doctor or health care professional even if you think your condition is better. Do not stop using except on the advice of your doctor or health care professional. Talk to your pediatrician regarding the use of this medicine in children. While this drug maybe prescribed for children as young as 12 years for selected conditions, precautions do apply. Overdosage: If you think you have taken too much of this medicine contact a poison control center or emergency room at once. NOTE: This medicine is only  for you. Do not share this medicine with others. What if I miss a dose? If you miss a dose, use it as soon as you can. If it is almost time for your next dose, use only that dose. Do not use double or extra doses. What may interact with this medicine? Do not take this medicine with any of the following medications:  alcohol or any product that contains alcohol  cisapride  disulfiram  dronedarone  pimozide  thioridazine This medicine may also interact with the following medications:  amiodarone  birth control pills  busulfan  carbamazepine  cimetidine  cyclosporine  fluorouracil  lithium  other medicines that prolong the QT interval (cause an abnormal heart rhythm) like dofetilide, ziprasidone  phenobarbital  phenytoin  quinidine  tacrolimus  vecuronium  warfarin This list may not describe all possible interactions. Give your health care provider a list of all the medicines, herbs, non-prescription drugs, or dietary supplements you use. Also tell them if you smoke, drink alcohol, or use illegal drugs. Some items may interact with your medicine. What should I watch for while using this medicine? Tell your doctor or health care professional if your symptoms do not improve or if they get worse. You may get drowsy or dizzy. Do not drive, use machinery, or do anything that needs mental alertness until you know how this medicine affects you. Do not stand or sit up quickly, especially if you are an older patient. This reduces the risk of dizzy or fainting spells. Ask your doctor or health care professional if you should avoid alcohol. Many nonprescription cough and  cold products contain alcohol. Metronidazole can cause an unpleasant reaction when taken with alcohol. The reaction includes flushing, headache, nausea, vomiting, sweating, and increased thirst. The reaction can last from 30 minutes to several hours. If you are being treated for a sexually transmitted disease,  avoid sexual contact until you have finished your treatment. Your sexual partner may also need treatment. What side effects may I notice from receiving this medicine? Side effects that you should report to your doctor or health care professional as soon as possible:  allergic reactions like skin rash, itching or hives, swelling of the face, lips, or tongue  confusion  fast, irregular heartbeat  fever, chills, sore throat  fever with rash, swollen lymph nodes, or swelling of the face  pain, tingling, numbness in the hands or feet  redness, blistering, peeling or loosening of the skin, including inside the mouth  seizures  sign and symptoms of liver injury like dark yellow or brown urine; general ill feeling or flu-like symptoms; light colored stools; loss of appetite; nausea; right upper belly pain; unusually weak or tired; yellowing of the eyes or skin  vaginal discharge, itching, or odor in women Side effects that usually do not require medical attention (report to your doctor or health care professional if they continue or are bothersome):  changes in taste  diarrhea  headache  nausea, vomiting  stomach pain This list may not describe all possible side effects. Call your doctor for medical advice about side effects. You may report side effects to FDA at 1-800-FDA-1088. Where should I keep my medicine? Keep out of the reach of children. Store at room temperature between 15 and 30 degrees C (59 and 86 degrees F). Do not freeze. Throw away any unused medicine after the expiration date. NOTE: This sheet is a summary. It may not cover all possible information. If you have questions about this medicine, talk to your doctor, pharmacist, or health care provider.  2021 Elsevier/Gold Standard (2018-05-23 06:53:27) Bacterial Vaginosis  Bacterial vaginosis is an infection that occurs when the normal balance of bacteria in the vagina changes. This change is caused by an overgrowth of  certain bacteria in the vagina. Bacterial vaginosis is the most common vaginal infection among females aged 9 to 65 years. This condition increases the risk of sexually transmitted infections (STIs). Treatment can help reduce this risk. Treatment is very important for pregnant women because this condition can cause babies to be born early (prematurely) or at a low birth weight. What are the causes? This condition is caused by an increase in harmful bacteria that are normally present in small amounts in the vagina. However, the exact reason this condition develops is not known. You cannot get bacterial vaginosis from toilet seats, bedding, swimming pools, or contact with objects around you. What increases the risk? The following factors may make you more likely to develop this condition:  Having a new sexual partner or multiple sexual partners, or having unprotected sex.  Douching.  Having an intrauterine device (IUD).  Smoking.  Abusing drugs and alcohol. This may lead to riskier sexual behavior.  Taking certain antibiotic medicines.  Being pregnant. What are the signs or symptoms? Some women with this condition have no symptoms. Symptoms may include:  Wallace Cullens or white vaginal discharge. The discharge can be watery or foamy.  A fish-like odor with discharge, especially after sex or during menstruation.  Itching in and around the vagina.  Burning or pain with urination. How is this diagnosed? This condition  is diagnosed based on:  Your medical history.  A physical exam of the vagina.  Checking a sample of vaginal fluid for harmful bacteria or abnormal cells. How is this treated? This condition is treated with antibiotic medicines. These may be given as a pill, a vaginal cream, or a medicine that is put into the vagina (suppository). If the condition comes back after treatment, a second round of antibiotics may be needed. Follow these instructions at home: Medicines  Take or  apply over-the-counter and prescription medicines only as told by your health care provider.  Take or apply your antibiotic medicine as told by your health care provider. Do not stop using the antibiotic even if you start to feel better. General instructions  If you have a female sexual partner, tell her that you have a vaginal infection. She should follow up with her health care provider. If you have a female sexual partner, he does not need treatment.  Avoid sexual activity until you finish treatment.  Drink enough fluid to keep your urine pale yellow.  Keep the area around your vagina and rectum clean. ? Wash the area daily with warm water. ? Wipe yourself from front to back after using the toilet.  If you are breastfeeding, talk to your health care provider about continuing breastfeeding during treatment.  Keep all follow-up visits. This is important. How is this prevented? Self-care  Do not douche.  Wash the outside of your vagina with warm water only.  Wear cotton or cotton-lined underwear.  Avoid wearing tight pants and pantyhose, especially during the summer. Safe sex  Use protection when having sex. This includes: ? Using condoms. ? Using dental dams. This is a thin layer of a material made of latex or polyurethane that protects the mouth during oral sex.  Limit the number of sexual partners. To help prevent bacterial vaginosis, it is best to have sex with just one partner (monogamous relationship).  Make sure you and your sexual partner are tested for STIs. Drugs and alcohol  Do not use any products that contain nicotine or tobacco. These products include cigarettes, chewing tobacco, and vaping devices, such as e-cigarettes. If you need help quitting, ask your health care provider.  Do not use drugs.  Do not drink alcohol if: ? Your health care provider tells you not to do this. ? You are pregnant, may be pregnant, or are planning to become pregnant.  If you  drink alcohol: ? Limit how much you have to 0-1 drink a day. ? Be aware of how much alcohol is in your drink. In the [place] one drink equals one 12 oz bottle of beer (355 mL), one 5 oz glass of wine (148 mL), or one 1 oz glass of hard liquor (44 mL). Where to find more information  Centers for Disease Control and Prevention: FootballExhibition.com.br  American Sexual Health Association (ASHA): www.ashastd.org  [place].S[place] Department of Health and Health and safety inspector, Office on Women's Health: http://hoffman.com/ Contact a health care provider if:  Your symptoms do not improve, even after treatment.  You have more discharge or pain when urinating.  You have a fever or chills.  You have pain in your abdomen or pelvis.  You have pain during sex.  You have vaginal bleeding between menstrual periods. Summary  Bacterial vaginosis is a vaginal infection that occurs when the normal balance of bacteria in the vagina changes. It results from an overgrowth of certain bacteria.  This condition increases the risk of sexually transmitted infections (  STIs). Getting treated can help reduce this risk.  Treatment is very important for pregnant women because this condition can cause babies to be born early (prematurely) or at low birth weight.  This condition is treated with antibiotic medicines. These may be given as a pill, a vaginal cream, or a medicine that is put into the vagina (suppository). This information is not intended to replace advice given to you by your health care provider. Make sure you discuss any questions you have with your health care provider. Document Revised: 11/29/2019 Document Reviewed: 11/29/2019 Elsevier Patient Education  2021 [employer].

## 2020-09-26 NOTE — MAU Provider Note (Signed)
None     S [name] is a 20 y.o. G1P0 patient who presents to MAU today with complaint of vaginal spotting. She states she notes some light spotting with wiping and contributes it to her untreated BV infection.  She reports some occasional cramping, but none currently.  Patient declines vaginal exam.   O BP 109/70   Pulse 83   Temp 98.2 F (36.8 C)   Resp 16   Ht 5\' 1"  (1.549 m)   Wt 51.9 kg   LMP 08/02/2020   SpO2 99%   BMI 21.63 kg/m  Physical Exam Constitutional:      Appearance: She is well-developed.  HENT:     Head: Normocephalic and atraumatic.  Cardiovascular:     Rate and Rhythm: Normal rate and regular rhythm.  Pulmonary:     Effort: Pulmonary effort is normal.     Breath sounds: Normal breath sounds.  Abdominal:     General: Abdomen is flat.     Palpations: Abdomen is soft.     Tenderness: There is no abdominal tenderness.  Neurological:     Mental Status: She is alert and oriented to person, place, and time.  Psychiatric:        Mood and Affect: Mood normal.        Behavior: Behavior normal.     A Medical screening exam complete Vaginal Spotting Known BV Infection  P Discussed treatment options and patient desires metrogel. Instructed to monitor symptoms and report any worsening. Rx sent to pharmacy on file. Patient instructed to keep 08/04/2020 appt as scheduled. Patient without questions or concerns. Discharge from MAU in stable condition  [place], Gerrit Heck 09/26/2020 11:41 PM

## 2020-09-26 NOTE — MAU Note (Signed)
PT SAYS SHE WAS HERE 2 WEEKS AGO- DX- WITH BV - NO MEDS WAS SENT TO PHARMACY.  THINKS ITS WORSE.  HAS BEEN CRAMPING - BECAME WORSE Tuesday. STARTED VB TODAY WHEN SHE WIPES .

## 2020-09-27 DIAGNOSIS — Z3A00 Weeks of gestation of pregnancy not specified: Secondary | ICD-10-CM | POA: Diagnosis not present

## 2020-09-27 DIAGNOSIS — Z3A01 Less than 8 weeks gestation of pregnancy: Secondary | ICD-10-CM | POA: Diagnosis not present

## 2020-09-27 DIAGNOSIS — O23599 Infection of other part of genital tract in pregnancy, unspecified trimester: Secondary | ICD-10-CM | POA: Diagnosis not present

## 2020-09-27 DIAGNOSIS — O26851 Spotting complicating pregnancy, first trimester: Secondary | ICD-10-CM | POA: Diagnosis not present

## 2020-09-27 DIAGNOSIS — B9689 Other specified bacterial agents as the cause of diseases classified elsewhere: Secondary | ICD-10-CM | POA: Diagnosis not present

## 2020-09-27 DIAGNOSIS — O23591 Infection of other part of genital tract in pregnancy, first trimester: Secondary | ICD-10-CM | POA: Diagnosis not present

## 2020-10-01 ENCOUNTER — Ambulatory Visit
Admission: RE | Admit: 2020-10-01 | Discharge: 2020-10-01 | Disposition: A | Discharge status: Home / Self Care | Payer: BC Managed Care – PPO | Source: Ambulatory Visit

## 2020-10-01 ENCOUNTER — Other Ambulatory Visit: Payer: Self-pay

## 2020-10-01 ENCOUNTER — Telehealth: Payer: Self-pay | Admitting: Medical

## 2020-10-01 DIAGNOSIS — Z3687 Encounter for antenatal screening for uncertain dates: Secondary | ICD-10-CM | POA: Insufficient documentation

## 2020-10-01 DIAGNOSIS — O208 Other hemorrhage in early pregnancy: Secondary | ICD-10-CM | POA: Diagnosis not present

## 2020-10-01 DIAGNOSIS — Z3A01 Less than 8 weeks gestation of pregnancy: Secondary | ICD-10-CM | POA: Diagnosis not present

## 2020-10-01 NOTE — Telephone Encounter (Signed)
I called Michelle Stewart today at 12:12 PM and confirmed patient's identity using two patient identifiers. [place] results from earlier today were reviewed. Patient is scheduled for new OB visit at [facility] on 10/08/20. First trimester warning signs reviewed. Patient voiced understanding and had no further questions.   US OB Transvaginal  Result Date: 10/01/2020 CLINICAL DATA:  Uncertain LMP, first trimester pregnancy; LMP 08/02/2020 EXAM: TRANSVAGINAL OB ULTRASOUND TECHNIQUE: Transvaginal ultrasound was performed for complete evaluation of the gestation as well as the maternal uterus, adnexal regions, and pelvic cul-de-sac. COMPARISON:  09/07/2020 FINDINGS: Intrauterine gestational sac: Present, single Yolk sac:  Present Embryo:  Present Cardiac Activity: Present Heart Rate: 148 bpm CRL:   12.3 mm   7 w 3 d                  [place] EDC: 05/17/2021 Subchorionic hemorrhage: Questionable tiny subchronic hemorrhage image 18 Maternal uterus/adnexae: Remainder of uterus unremarkable. Small corpus luteum RIGHT ovary. Ovaries otherwise normal appearance. Trace free pelvic fluid, nonspecific. No adnexal masses. IMPRESSION: Single live intrauterine gestation at 7 weeks 3 days EGA by crown-rump length. Questionable tiny subchronic hemorrhage. Electronically Signed   By: Ulyses Southward M.D.   On: 10/01/2020 11:49    Marny Lowenstein, PA-C 10/01/2020 12:12 PM

## 2020-10-02 ENCOUNTER — Telehealth: Payer: Self-pay | Admitting: General Practice

## 2020-10-02 NOTE — Telephone Encounter (Signed)
Patient called into front office with questions about her ultrasound. Patient states she is confused about when her due date is and how far along she is because the ultrasound says one thing & her period says another. Reviewed with patient ultrasound shows an 8 day discrepancy between her period thus her ultrasound is the better indicator of how far along she is. Reviewed due date of 05/17/21 based off ultrasound making her [redacted]w[redacted]d. Patient verbalized understanding & confirmed she is taking PNV. Patient had no other questions.

## 2020-10-08 ENCOUNTER — Telehealth (INDEPENDENT_AMBULATORY_CARE_PROVIDER_SITE_OTHER): Payer: BC Managed Care – PPO

## 2020-10-08 DIAGNOSIS — Z3400 Encounter for supervision of normal first pregnancy, unspecified trimester: Secondary | ICD-10-CM

## 2020-10-08 DIAGNOSIS — Z3490 Encounter for supervision of normal pregnancy, unspecified, unspecified trimester: Secondary | ICD-10-CM | POA: Insufficient documentation

## 2020-10-08 MED ORDER — PROMETHAZINE HCL 25 MG PO TABS: 25.0000 mg | ORAL_TABLET | Freq: Four times a day (QID) | ORAL | 0 refills | Status: DC | PRN

## 2020-10-08 NOTE — Progress Notes (Addendum)
New OB Intake  I connected with  Michelle Stewart on 10/08/20 at 10:15 AM EDT by telephone and verified that I am speaking with the correct person using two identifiers. Nurse is located at [facility] and pt is located at home.  I discussed the limitations, risks, security and privacy concerns of performing an evaluation and management service by telephone and the availability of in person appointments. I also discussed with the patient that there may be a patient responsible charge related to this service. The patient expressed understanding and agreed to proceed.  I explained I am completing New OB Intake today. We discussed her EDD of 05/17/2021 that is based on first trimester ultrasound on 10/01/20. Pt is G1/P0. I reviewed her allergies, medications, Medical/Surgical/OB history, and appropriate screenings. I informed her of [facility] services. Based on history, this is a/an uncomplicated pregnancy.  Patient Active Problem List   Diagnosis Date Noted  . Adjustment disorder with mixed disturbance of emotions and conduct 04/20/2018    Concerns addressed today  Delivery Plans:  Plans to deliver at [facility].   MyChart/Babyscripts MyChart access verified. I explained pt will have some visits in office and some virtually. Babyscripts instructions given. Account successfully created and app downloaded.  Blood Pressure Cuff Patient has private insurance; instructed to purchase blood pressure cuff and bring to first prenatal appt. Explained after first prenatal appt pt will check weekly and document in Babyscripts.  Anatomy US Explained first scheduled [place] will be around 19 weeks. Anatomy US scheduled for July 11th @ 1400.    Labs Discussed Avelina Laine genetic screening with patient. Would like both Panorama and Horizon drawn at new OB visit. Routine prenatal labs needed.  Covid Vaccine Patient has not covid vaccine.   [facility] Referral Patient is interested in referral to [facility].   Pregnancy  Navigator Informed patient a pregnancy navigator will see her at her first ob visit with provider.   [place] appointments Childcare: Informed patient when she has Ultrasound appointments she will not be able to bring child/ children with her to Ultrasound appointments. Asked if childcare would be an issue.   First visit review I reviewed new OB appt with pt. I explained she will have a pelvic exam, ob bloodwork with genetic screening, and GC/CH as she is not 20 years of age for a pap smear. Pt reports that she has been getting pap smears because she was told that because she was sexually active that she needed one.  Pt reports that she will try to get her recent pap smear results and bring to her appt on 10/23/20.  Explained pt will be seen by Dr. Donavan Foil at first visit; encounter routed to appropriate provider.   Ralene Bathe, RN 10/08/2020  10:18 AM

## 2020-10-08 NOTE — Progress Notes (Signed)
Patient was assessed and managed by nursing staff during this encounter. I have reviewed the chart and agree with the documentation and plan. I have also made any necessary editorial changes.  Warden Fillers, MD 10/08/2020 11:22 AM

## 2020-10-08 NOTE — Patient Instructions (Signed)
.  cwh 

## 2020-10-15 ENCOUNTER — Encounter: Payer: BC Managed Care – PPO | Admitting: Family Medicine

## 2020-10-23 ENCOUNTER — Other Ambulatory Visit: Payer: Self-pay

## 2020-10-23 ENCOUNTER — Ambulatory Visit (INDEPENDENT_AMBULATORY_CARE_PROVIDER_SITE_OTHER): Payer: BC Managed Care – PPO | Admitting: Obstetrics and Gynecology

## 2020-10-23 ENCOUNTER — Other Ambulatory Visit (HOSPITAL_COMMUNITY)
Admission: RE | Admit: 2020-10-23 | Discharge: 2020-10-23 | Disposition: A | Discharge status: Home / Self Care | Payer: BC Managed Care – PPO | Source: Ambulatory Visit | Attending: Family Medicine | Admitting: Family Medicine

## 2020-10-23 VITALS — BP 117/72 | HR 120 | Wt 124.0 lb

## 2020-10-23 DIAGNOSIS — Z3A10 10 weeks gestation of pregnancy: Secondary | ICD-10-CM

## 2020-10-23 DIAGNOSIS — Z3490 Encounter for supervision of normal pregnancy, unspecified, unspecified trimester: Secondary | ICD-10-CM

## 2020-10-23 DIAGNOSIS — O219 Vomiting of pregnancy, unspecified: Secondary | ICD-10-CM

## 2020-10-23 DIAGNOSIS — Z3481 Encounter for supervision of other normal pregnancy, first trimester: Secondary | ICD-10-CM | POA: Diagnosis not present

## 2020-10-23 DIAGNOSIS — Z23 Encounter for immunization: Secondary | ICD-10-CM

## 2020-10-23 LAB — POCT URINALYSIS DIP (DEVICE)
Bilirubin Urine: NEGATIVE
Glucose, UA: NEGATIVE mg/dL
Hgb urine dipstick: NEGATIVE
Ketones, ur: NEGATIVE mg/dL
Nitrite: NEGATIVE
Protein, ur: NEGATIVE mg/dL
Specific Gravity, Urine: >=1.03 (ref 1.005–1.030)
Urobilinogen, UA: 0.2 mg/dL (ref 0.0–1.0)
pH: 6.5 (ref 5.0–8.0)

## 2020-10-23 LAB — OB RESULTS CONSOLE GC/CHLAMYDIA: Gonorrhea: NEGATIVE

## 2020-10-23 LAB — HEPATITIS C ANTIBODY: HCV Ab: NEGATIVE

## 2020-10-23 MED ORDER — ONDANSETRON 4 MG PO TBDP: 4.0000 mg | ORAL_TABLET | Freq: Four times a day (QID) | ORAL | 0 refills | Status: DC | PRN

## 2020-10-23 NOTE — Progress Notes (Signed)
Panorma/Horizon completed. Pregnany Screening Risk Form Completed.

## 2020-10-23 NOTE — Progress Notes (Signed)
  Subjective:    Michelle Stewart is a G1P0 [redacted]w[redacted]d being seen today for her first obstetrical visit.  Her obstetrical history is significant for none. Patient does intend to breast feed. Pregnancy history fully reviewed.   Patient reports nausea. Having nausea and vomiting. Phenergan makes her really dizzy. Using pregnancy pops which help a lot. Nausea is improving but still present. Eats small meals which helps.  No prior medical issues. Reports mood is good. Feels excited!   No tobacco use or marijuana use No alcohol use.   Lives with boyfriend. He's very excited!   Vitals:   10/23/20 0900  BP: 117/72  Pulse: (!) 120  Weight: 124 lb (56.2 kg)    HISTORY: OB History  Gravida Para Term Preterm AB Living  1            SAB IAB Ectopic Multiple Live Births               # Outcome Date GA Lbr Len/2nd Weight Sex Delivery Anes PTL Lv  1 Current            No past medical history on file. Past Surgical History:  Procedure Laterality Date  . MANDIBLE SURGERY     No family history on file.   Exam     Skin: normal coloration and turgor, no rashes    Neurologic: oriented, normal   Extremities: normal strength, tone, and muscle mass   HEENT PERRLA   Mouth/Teeth mucous membranes moist, pharynx normal without lesions   Neck supple   Cardiovascular: regular rate and rhythm   Respiratory:  appears well, vitals normal, no respiratory distress, acyanotic, normal RR, ear and throat exam is normal, neck free of mass or lymphadenopathy, chest clear, no wheezing, crepitations, rhonchi, normal symmetric air entry   Abdomen: soft, non-tender; bowel sounds normal; no masses,  no organomegaly   Urinary: urethral meatus normal      Assessment:    Pregnancy: G1P0 Patient Active Problem List   Diagnosis Date Noted  . Supervision of low-risk pregnancy 10/08/2020  . Adjustment disorder with mixed disturbance of emotions and conduct 04/20/2018        Plan:    Supervision of low-risk  pregnancy -welcomed to practice and explained nature of practice including midwives, APPs, physicians, students.  -zofran prn for breakthrough nausea. D/C phenergan.  Initial labs drawn. Prenatal vitamins. Problem list reviewed and updated. Genetic Screening discussed Integrated Screen: requested.  Ultrasound discussed; fetal survey: requested.  Follow up in 4 weeks.  Gita Kudo 10/23/2020

## 2020-10-23 NOTE — Patient Instructions (Signed)
AREA PEDIATRIC/FAMILY PRACTICE PHYSICIANS  [place] ([address]) . [facility] o Chambliss, MD; Eniola, MD; Hale, MD; Hensel, MD; McDiarmid, MD; McIntyer, MD; Neal, MD; Walden, MD o [address] [place], [place] [address] o [phone] o Mon-Fri 8:30-12:30, 1:30-5:00 o Providers come to see babies at [facility] o Accepting Medicaid . [facility] o Limited providers who accept newborns: Koirala, MD; Morrow, MD; Wolters, MD o [address], [place], [place] [address] o [phone] o Mon-Fri 8:00-5:30 o Babies seen by providers at [facility] o Does NOT accept Medicaid o Please call early in hospitalization for appointment (limited availability)  . [facility] o Mulberry, MD o [address] [place], [place] [address] o [phone] o Mon, Tue, Thur, Fri 8:30-5:00, Wed 10:00-7:00 (closed 1-2pm) o Babies seen by [facility] providers o Accepting Medicaid . [facility] o Rubin, MD o [address], [place], [place] [address] o [phone] o Mon-Fri 8:30-5:00, Sat 8:30-12:00 o Provider comes to see babies at [facility] o Accepting Medicaid o Must have been referred from current patients or contacted office prior to delivery . [facility] ([facility]) o Brown, MD; Chandler, MD; Ettefagh, MD; Grant, MD; Lester, MD; McCormick, MD; McQueen, MD; Prose, MD; Simha, MD; Stanley, MD; Stryffeler, NP; Tebben, NP o [address], [place], [place] [address] o [phone] o Mon, Tue, Thur, Fri 8:30-5:30, Wed 9:30-5:30, Sat 8:30-12:30 o Babies seen by [facility] providers o Accepting Medicaid o Only accepting infants of first-time parents or siblings of current patients o Hospital discharge coordinator will make follow-up appointment . Jack Amos o [address],  [place], [place]  [address] o [phone]   Fax - [phone] . [facility] o [address], [place], [place]  [address] o Phone - [phone]   Fax - [phone] . Shilpa Gosrani o [address], [place], [place]  [address] o [phone]  [place] ([address]) . [facility] o Bates, MD; Brassfield, MD; Cooper, Cox, MD; MD; Davis, MD; Dovico, MD; Ettefaugh, MD; Little, MD; Lowe, MD; Keiffer, MD; Melvin, MD; Sumner, MD; Williams, MD o [address], [place], [place] [address] o [phone] o Mon-Fri 8:30-5:00 (extended evenings Mon-Thur as needed), Sat-Sun 10:00-1:00 o Providers come to see babies at [facility] o Accepting Medicaid for families of first-time babies and families with all children in the household age 3 and under. Must register with office prior to making appointment (M-F only). . [facility] o Henson, NP; Knapp, MD; Lalonde, MD; Tysinger, PA o [address] [place], [place] [address] o [phone] o Mon-Fri 8:00-5:00 o Babies seen by providers at [facility] o Does NOT accept Medicaid/Commercial Insurance Only . [facility] ([facility])  o Artis, MD; Barnes, MD; Bratton, MD; Coccaro, MD; Lockett Gardner, MD; Kramer, MD; Marshall, MD; Netherton, MD; Poleto, MD; Skinner, MD o [address] [place], [place] [address] o [phone] o Mon-Fri 8:30-5:30, Sat (Oct.-Mar.) 9:00-1:00 o Babies seen by providers at [facility] o Accepting Medicaid  [place] ([address]) . [facility] o Reid, MD; Warner, MD o [address], [place], [place] [address] o [phone] o Mon-Fri 8:30-5:00, Sat 8:30-12:00 o Providers come to see babies at [facility] o Does NOT accept Medicaid . [facility] o Becker, PA; Hagler, MD; Scifres, PA; Sun, MD; Swayne, MD o [address],  [place], [place] [address] o [phone] o Mon-Fri 8:00-5:00 o Babies seen by providers at [facility] o Does NOT accept Medicaid o Only accepting babies of parents who are patients o Please call early in hospitalization for appointment (limited availability) . [facility] o Clark, MD; Frye, MD; Kelleher, MD; Mack, NP; Miller, MD; O'Keller, MD; Patterson, NP; Pudlo, MD; Puzio, MD; Thomas, MD; Tucker, MD; Twiselton, MD o [address], [place], [place] [address] o [phone] o Mon-Fri 8:00-5:00, Sat 9:00-12:00 o Providers come to see babies at [facility] o Does NOT accept Medicaid  [place] ([address]) . [facility] o Limited providers accepting new patients: Brake, NP; Wharton, PA o [address], [place], [place] [address] o [phone] o Mon-Fri 8:00-5:00 o Babies seen by providers at [facility] o Does NOT accept Medicaid o Only accepting babies of parents who are patients o Please call early in hospitalization for appointment (limited availability) . [facility] o Gay, MD; Quinlan, MD o [address] [place], [place] [address] o [phone] (press 1 to schedule appointment) o Mon-Fri 8:00-5:00 o Providers come to see babies at [facility] o Does NOT accept Medicaid . [facility] o Mazer, MD o [address] [place], [place] [address] o [phone] o Mon-Fri 8:30-5:00 (lunch 12:30-1:00), extended hours by appointment only Wed 5:00-6:30 o Babies seen by [facility] providers o Accepting Medicaid . [facility] o Banks, MD; Jordan, MD; Koberlein, MD o [address], [place], [place] [address] o [phone] o Mon-Fri 8:00-5:00 o Babies seen by [facility] providers o Does NOT accept Medicaid . [facility] o Parker, MD; Hunter, MD; Wallace, DO o [address] [place], [place]  [address] o [phone] o Mon-Fri 8:00-5:00 o Babies seen by [facility] providers o Does NOT accept Medicaid . [facility] o Brandon, PA; Brecken, PA; Christy, NP; Dees, MD; DeClaire, MD; DeWeese, MD; Hansen, NP; Mills, NP; Parrish, NP; Smoot, NP; Summer, MD; Vapne, MD o [address] [place], [place] [address] o [phone] o Mon-Fri 8:30-5:00, Sat 10:00-1:00 o Providers come to see babies at [facility] o Does NOT accept Medicaid o Free prenatal information session Tuesdays at 4:45pm . [facility] o Bouska, MD; Gordon, PA; Jeffery, PA; Weber, PA o [address] [place] [address] o [phone] o Mon-Fri 7:30-5:30 o Babies seen by [facility] providers . [facility] o [address], [place], [place]  [address] o [phone]   Fax - [phone]  [place] ([address]) . [facility] o [place], [place] o [address] [place], [place] [address] o [phone] o Mon-Thur 8:00-6:00 o Providers come to see babies at [facility] o Accepting Medicaid . [facility] o Anderson, NP; Badger, MD; Beal, PA; Spencer, PA o [address] [place], [place] [address] o [phone] o Mon-Thur 7:30-7:30, Fri 7:30-4:30 o Babies seen by [facility] providers o Accepting Medicaid . [facility] o Agbuya, MD; Klett, NP; Romgoolam, MD o [address], [place], [place] [address] o [phone] o Mon-Fri 8:30-5:00, Sat 8:30-12:00 o Providers come to see babies at [facility] o Accepting Medicaid o Must have "Meet & Greet" appointment at office prior to delivery . [facility] o McCord,   MD; Wallace, MD; Wood, MD o [address], [place], [place] [address] o [phone] o Mon-Wed 8:00-6:00, Thur-Fri 8:00-5:00, Sat 9:00-12:00 o Providers come to  see babies at [facility] o Does NOT accept Medicaid o Only accepting siblings of current patients . [facility]  o [address], [place], [place]  [address] o [phone]   Fax - [phone] . [facility] o [address], [place], [place]  [address] o [phone]   Fax - [phone]  [place] ([address]) . [facility] o Cirigliano, DO; Matthews, DO o [address] [place], [place] [address] o [phone] o Mon-Fri 7:00-5:00 o Babies seen by [facility] providers o Does NOT accept Medicaid . [facility] o Briscoe, MD; Howley, PA; Moreira, PA o [address], [place], [place] [address] o [phone] o Mon-Fri 8:00-5:00 o Babies seen by [facility] providers o Accepting Medicaid . [facility] o Boyd, MD; Church, PA; Jones, NP; Osborn, PA o [address], [place], [place] [address] o [phone] o Mon-Fri 8:00-5:00 o Babies seen by providers at [facility] o Accepting Medicaid  [place] ([address]) . [facility] [place] o Wendling, DO o [address] [place], [place] [address] o [phone] o Mon-Fri 8:00-5:00 o Babies seen by [facility] providers o Does NOT accept Medicaid o Limited availability, please call early in hospitalization to schedule follow-up . [facility] o Calderon, PA; Cummings, MD; Dillard, MD; Martin, PA; Olson, MD; VanDeven, PA o [address], [place], [place] [address] o [phone] o Mon-Fri 8:30-5:00, Sat 9:00-12:00 o Babies seen by providers at [facility] o Accepting Medicaid o Please register online then schedule online or call office o [email or web address] . [facility] ([facility] o Hunter, NP; Kumar, MD; Martin Rogers, PA o [address], [place], [place] [address] o [phone] o Mon-Fri 8:00-5:00 o Babies seen by providers at [facility] o Accepting Medicaid . [facility] ([facility] o [place], MD; Kristi Fleenor, NP; West, MD o [address], [place], [place] [address] o [phone] o Mon-Fri 8:00-5:30, Sat&Sun by appointment (phones open at 8:30) o Babies seen by [facility] providers o Accepting Medicaid o Must be a first-time baby or sibling of current patient . [facility] [place]  o [address], [place], [place]  [address] o [phone]   Fax - [phone]  [place] ([address]) . [facility] o Brown, PA; Cowen, PA; Rice, MD; Helton, PA; Spry, MD o [address] [place], [place] [address] o [phone] o Mon-Thur 8:00-7:00, Fri 8:00-5:00, Sat 8:00-12:00, Sun 9:00-12:00 o Babies seen by [facility] providers o Accepting Medicaid . [facility] o Coe-Goins, MD; Marshall, MD; Pierre-Louis, MD o [address] [place] [address], [place], [place] [address] o [phone] o Mon-Thur 8:00-5:00 o Babies seen by providers at [facility] o Accepting Medicaid . [facility] o Bratton, MD; Coe-Goins, MD; Hayes, MD; Lewis, MD; List, MD; Lott, MD; Marshall, MD; Moran, MD; O'Neal, MD; Pierre-Louis, MD; Pitonzo, MD; Scholer, MD; Spangle, MD o [address] [place], [place]   [address] o [phone] o Mon-Fri 8:00-5:30, Sat (Oct.-Mar.) 9:00-1:00 o Babies seen by providers at [facility] o Accepting Medicaid o Must fill out new patient packet, available online at [email or web address] . [facility] ([facility]) o Friddle, NP; Harris, NP; Kelly, NP; Logan, MD;  Melvin, PA; Poth, MD; Ramadoss, MD; Stanton, NP o [address], [place], [place] [address] o [phone] o Mon-Thur 8:00-5:30, Fri 8:00-5:00 o Babies seen by providers at [facility] o Accepting Medicaid  [place] ([address]) . [facility] o Dixon, PA; Campus, MD; Pickard, MD; Tapia, PA o [address], [place], [place] [address] o [phone] o Mon-Fri 8:00-5:00 o Babies seen by providers at [facility] o Accepting Medicaid   [place] ([address]) . [facility] [place] o Masneri, DO; Meyers, MD; Nelson, PA o [address], [place], [place] [address] o [phone] o Mon-Fri 8:00-5:00 o Babies seen by providers at [facility] o Does NOT accept Medicaid o Limited appointment availability, please call early in hospitalization  . [facility] [place] o Kunedd, DO; McGowen, MD o [address], [place], [place] [address] o [phone] o Mon-Fri 8:00-5:00 o Babies seen by [facility] providers o Does NOT accept Medicaid . [facility] [place] o Cameron, MD; MacDonald, MD; Michaels, PA; Nayak, MD o [address] [place] [address], [place], [place] [address] o [phone] o Mon-Fri 8:00-5:00 o After hours clinic ([address] , [place] [address]) [phone] Mon-Fri 5:00-8:00, Sat 12:00-6:00, Sun 10:00-4:00 o Babies seen by [facility] providers o Accepting Medicaid . [facility] [place] o [address], [place], [place]  [address] o [phone]   Fax - [phone]  [place] ([address]) .  [facility] [place] [facility] o Andy, MD o [address], [place], [place] [address] o [phone] o Mon-Fri 8:00-5:00 o Babies seen by [facility] providers o Does NOT accept Medicaid . [facility] [place] ([facility] [place]) o Eksir, MD o [address], [place], [place]  [address] o [phone] o Mon-Thur 8:00-7:00, Fri 8:00-5:00, Sat 8:00-12:00 o Babies seen by providers at [facility] o Accepting Medicaid - but does not have vaccinations in office (must be received elsewhere) o Limited availability, please call early in hospitalization  [place] ([address]) . [facility]  o Charlene Flemming, MD o [address],  [place] [address] o [phone]  Fax [phone]   

## 2020-10-24 LAB — GC/CHLAMYDIA PROBE AMP (~~LOC~~) NOT AT ARMC
Chlamydia: NEGATIVE
Comment: NEGATIVE
Comment: NORMAL
Neisseria Gonorrhea: NEGATIVE

## 2020-10-25 LAB — URINE CULTURE, OB REFLEX

## 2020-10-25 LAB — CULTURE, OB URINE

## 2020-10-28 ENCOUNTER — Encounter: Payer: Self-pay | Admitting: *Deleted

## 2020-10-28 LAB — CBC
Hematocrit: 41.6 % (ref 34.0–46.6)
Hemoglobin: 14.3 g/dL (ref 11.1–15.9)
MCH: 33.8 pg — ABNORMAL HIGH (ref 26.6–33.0)
MCHC: 34.4 g/dL (ref 31.5–35.7)
MCV: 98 fL — ABNORMAL HIGH (ref 79–97)
Platelets: 225 10*3/uL (ref 150–450)
RBC: 4.23 x10E6/uL (ref 3.77–5.28)
RDW: 12.1 % (ref 11.7–15.4)
WBC: 13.4 10*3/uL — ABNORMAL HIGH (ref 3.4–10.8)

## 2020-10-28 LAB — RUBELLA SCREEN: Rubella Antibodies, IGG: 7.7 index (ref >0.99)

## 2020-10-28 LAB — HEMOGLOBIN A1C
Est. average glucose Bld gHb Est-mCnc: 82 mg/dL
Hgb A1c MFr Bld: 4.5 % — ABNORMAL LOW (ref 4.8–5.6)

## 2020-10-28 LAB — HEPATITIS C ANTIBODY: Hep C Virus Ab: <0.1 s/co ratio (ref 0.0–0.9)

## 2020-10-28 LAB — RPR: RPR Ser Ql: NONREACTIVE

## 2020-10-28 LAB — HEPATITIS B SURFACE ANTIGEN: Hepatitis B Surface Ag: NEGATIVE

## 2020-10-28 LAB — HIV ANTIBODY (ROUTINE TESTING W REFLEX): HIV Screen 4th Generation wRfx: NONREACTIVE

## 2020-11-05 ENCOUNTER — Encounter: Payer: Self-pay | Admitting: *Deleted

## 2020-11-24 ENCOUNTER — Other Ambulatory Visit: Payer: Self-pay

## 2020-11-24 ENCOUNTER — Ambulatory Visit (INDEPENDENT_AMBULATORY_CARE_PROVIDER_SITE_OTHER): Payer: Medicaid Other | Admitting: Student

## 2020-11-24 VITALS — BP 103/68 | HR 96 | Wt 121.9 lb

## 2020-11-24 DIAGNOSIS — Z3A15 15 weeks gestation of pregnancy: Secondary | ICD-10-CM | POA: Diagnosis not present

## 2020-11-24 DIAGNOSIS — Z3492 Encounter for supervision of normal pregnancy, unspecified, second trimester: Secondary | ICD-10-CM

## 2020-11-24 NOTE — Patient Instructions (Signed)
Round Ligament Pain  The round ligament is a cord of muscle and tissue that helps support the uterus. It can become a source of pain during pregnancy if it becomes stretched or twisted as the baby grows. The pain usually begins in the second trimester (13-28 weeks) of pregnancy, and it can come and go until the baby is delivered.It is not a serious problem, and it does not cause harm to the baby. Round ligament pain is usually a short, sharp, and pinching pain, but it can also be a dull, lingering, and aching pain. The pain is felt in the lower side of the abdomen or in the groin. It usually starts deep in the groin and moves up to the outside of the hip area. The pain may occur when you: Suddenly change position, such as quickly going from a sitting to standing position. Roll over in bed. Cough or sneeze. Do physical activity. Follow these instructions at home:  Watch your condition for any changes. When the pain starts, relax. Then try any of these methods to help with the pain: Sitting down. Flexing your knees up to your abdomen. Lying on your side with one pillow under your abdomen and another pillow between your legs. Sitting in a warm bath for 15-20 minutes or until the pain goes away. Take over-the-counter and prescription medicines only as told by your health care provider. Move slowly when you sit down or stand up. Avoid long walks if they cause pain. Stop or reduce your physical activities if they cause pain. Keep all follow-up visits as told by your health care provider. This is important. Contact a health care provider if: Your pain does not go away with treatment. You feel pain in your back that you did not have before. Your medicine is not helping. Get help right away if: You have a fever or chills. You develop uterine contractions. You have vaginal bleeding. You have nausea or vomiting. You have diarrhea. You have pain when you urinate. Summary Round ligament pain is  felt in the lower abdomen or groin. It is usually a short, sharp, and pinching pain. It can also be a dull, lingering, and aching pain. This pain usually begins in the second trimester (13-28 weeks). It occurs because the uterus is stretching with the growing baby, and it is not harmful to the baby. You may notice the pain when you suddenly change position, when you cough or sneeze, or during physical activity. Relaxing, flexing your knees to your abdomen, lying on one side, or taking a warm bath may help to get rid of the pain. Get help from your health care provider if the pain does not go away or if you have vaginal bleeding, nausea, vomiting, diarrhea, or painful urination. This information is not intended to replace advice given to you by your health care provider. Make sure you discuss any questions you have with your healthcare provider. Document Revised: 05/16/2020 Document Reviewed: 11/16/2017 Elsevier Patient Education  2022 Elsevier Inc.  

## 2020-11-24 NOTE — Progress Notes (Signed)
Patient stated that she has occasional "period cramps" about 2x out the week. Denies any vaginal bleeding. Other than that patient is feeling fine

## 2020-11-24 NOTE — Progress Notes (Signed)
   PRENATAL VISIT NOTE  Subjective:  Michelle Stewart is a 19 y.o. G1P0 at [redacted]w[redacted]d being seen today for ongoing prenatal care.  She is currently monitored for the following issues for this low-risk pregnancy and has Adjustment disorder with mixed disturbance of emotions and conduct and Supervision of low-risk pregnancy on their problem list.  Patient reports  some round ligament pain and feeling occasional cramp .  Contractions: Not present. Vag. Bleeding: None.  Movement: Present. Denies leaking of fluid.   The following portions of the patient's history were reviewed and updated as appropriate: allergies, current medications, past family history, past medical history, past social history, past surgical history and problem list.   Objective:   Vitals:   11/24/20 1401  BP: 103/68  Pulse: 96  Weight: 121 lb 14.4 oz (55.3 kg)    Fetal Status: Fetal Heart Rate (bpm): 150   Movement: Present     General:  Alert, oriented and cooperative. Patient is in no acute distress.  Skin: Skin is warm and dry. No rash noted.   Cardiovascular: Normal heart rate noted  Respiratory: Normal respiratory effort, no problems with respiration noted  Abdomen: Soft, gravid, appropriate for gestational age.  Pain/Pressure: Absent     Pelvic: Cervical exam deferred        Extremities: Normal range of motion.  Edema: None  Mental Status: Normal mood and affect. Normal behavior. Normal judgment and thought content.   Assessment and Plan:  Pregnancy: G1P0 at [redacted]w[redacted]d 1. [redacted] weeks gestation of pregnancy -reviewed comfort measures for RLP; discussed water immersion -keep [place] scheduled on 7/11 -will draw AFP today -reviewed genetic testing results  -confirmed Depo for BC-not OCP Preterm labor symptoms and general obstetric precautions including but not limited to vaginal bleeding, contractions, leaking of fluid and fetal movement were reviewed in detail with the patient. Please refer to After Visit Summary for other  counseling recommendations.   No follow-ups on file.  Future Appointments  Date Time Provider Department Center  12/22/2020  2:15 PM [facility]    Marylene Land, CNM

## 2020-11-26 LAB — AFP, SERUM, OPEN SPINA BIFIDA
AFP MoM: 0.97
AFP Value: 37.9 ng/mL
Gest. Age on Collection Date: 15.1 weeks
Maternal Age At EDD: 21 yr
OSBR Risk 1 IN: 10000
Test Results:: NEGATIVE
Weight: 122 [lb_av]

## 2020-12-10 ENCOUNTER — Inpatient Hospital Stay (HOSPITAL_COMMUNITY)
Admission: AD | Admit: 2020-12-10 | Discharge: 2020-12-10 | Disposition: A | Discharge status: Home / Self Care | Payer: Medicaid Other | Attending: Obstetrics & Gynecology | Admitting: Obstetrics & Gynecology

## 2020-12-10 ENCOUNTER — Inpatient Hospital Stay (HOSPITAL_BASED_OUTPATIENT_CLINIC_OR_DEPARTMENT_OTHER): Payer: Medicaid Other

## 2020-12-10 ENCOUNTER — Other Ambulatory Visit: Payer: Self-pay

## 2020-12-10 ENCOUNTER — Ambulatory Visit: Payer: Self-pay | Admitting: *Deleted

## 2020-12-10 ENCOUNTER — Encounter (HOSPITAL_COMMUNITY): Payer: Self-pay | Admitting: Obstetrics & Gynecology

## 2020-12-10 DIAGNOSIS — O26899 Other specified pregnancy related conditions, unspecified trimester: Secondary | ICD-10-CM

## 2020-12-10 DIAGNOSIS — R109 Unspecified abdominal pain: Secondary | ICD-10-CM

## 2020-12-10 DIAGNOSIS — N883 Incompetence of cervix uteri: Secondary | ICD-10-CM | POA: Diagnosis not present

## 2020-12-10 DIAGNOSIS — Z3A17 17 weeks gestation of pregnancy: Secondary | ICD-10-CM | POA: Diagnosis not present

## 2020-12-10 DIAGNOSIS — O321XX0 Maternal care for breech presentation, not applicable or unspecified: Secondary | ICD-10-CM

## 2020-12-10 DIAGNOSIS — Z3492 Encounter for supervision of normal pregnancy, unspecified, second trimester: Secondary | ICD-10-CM

## 2020-12-10 DIAGNOSIS — O26892 Other specified pregnancy related conditions, second trimester: Secondary | ICD-10-CM | POA: Diagnosis not present

## 2020-12-10 DIAGNOSIS — Z3686 Encounter for antenatal screening for cervical length: Secondary | ICD-10-CM

## 2020-12-10 LAB — URINALYSIS, ROUTINE W REFLEX MICROSCOPIC
Bacteria, UA: NONE SEEN
Bilirubin Urine: NEGATIVE
Glucose, UA: NEGATIVE mg/dL
Hgb urine dipstick: NEGATIVE
Ketones, ur: NEGATIVE mg/dL
Nitrite: NEGATIVE
Protein, ur: NEGATIVE mg/dL
Specific Gravity, Urine: 1.004 — ABNORMAL LOW (ref 1.005–1.030)
pH: 8 (ref 5.0–8.0)

## 2020-12-10 LAB — WET PREP, GENITAL
Clue Cells Wet Prep HPF POC: NONE SEEN
Sperm: NONE SEEN
Trich, Wet Prep: NONE SEEN
Yeast Wet Prep HPF POC: NONE SEEN

## 2020-12-10 NOTE — Telephone Encounter (Signed)
Reason for Disposition  MODERATE-SEVERE abdominal pain (e.g., interferes with normal activities, awakens from sleep)  Answer Assessment - Initial Assessment Questions 1. LOCATION: "Where does it hurt?"      Lower abdomin- left 2. RADIATION: "Does the pain shoot anywhere else?" (e.g., chest, back, shoulder)     Goes all around 3. ONSET: "When did the pain begin?" (e.g., minutes, hours or days ago)      5 am this morning 4. ONSET: "Gradual or sudden onset?"     sudden 5. PATTERN "Does the pain come and go, or has it been constant since it started?"    constant 6. SEVERITY: "How bad is the pain?" "What does it keep you from doing?"  (e.g., Scale 1-10; mild, moderate, or severe)   - MILD (1-3): doesn't interfere with normal activities, abdomen soft and not tender to touch    - MODERATE (4-7): interferes with normal activities or awakens from sleep, abdomen tender to touch    - SEVERE (8-10): excruciating pain, doubled over, unable to do any normal activities     severe 7. RECURRENT SYMPTOM: "Have you ever had this type of stomach pain before?" If Yes, ask: "When was the last time?" and "What happened that time?"      No- usually mild cramping 8. CAUSE: "What do you think is causing the stomach pain?"     cramping 9. RELIEVING/AGGRAVATING FACTORS: "What makes it better or worse?" (e.g., antacids, bowel movement, movement)     No- moving hurts 10. OTHER SYMPTOMS:"Do you have any other symptoms?" (e.g., back pain, diarrhea, fever, urination pain, vaginal discharge, vomiting)       Discharge- increase 11. EDD: "What date are you expecting to deliver?"       05/17/21  Protocols used: Pregnancy - Abdominal Pain Less Than [redacted] Weeks EGA-A-AH

## 2020-12-10 NOTE — MAU Provider Note (Signed)
Chief Complaint: Abdominal Pain   Event Date/Time   First Provider Initiated Contact with Patient 12/10/20 1047      SUBJECTIVE HPI: Michelle PortaDestiny Stewart is a 20 y.o. G1P0 at 4445w3d who presents to maternity admissions reporting onset of painful cramping at 5 am today. She reports she tried to walk but the cramping became worse. She is drinking water but only about 1/2 bottle of water today.  There is some increased discharge with mild odor but no bleeding or leaking fluid. There are no other symptoms and has not tried other treatments.   Location: lower abdomen Quality: cramping Severity: 7/10 on pain scale Duration: 1 day Timing: intermittent Modifying factors: walking made pain worse Associated signs and symptoms: increased vaginal discharge  HPI  History reviewed. No pertinent past medical history. Past Surgical History:  Procedure Laterality Date   MANDIBLE SURGERY     Social History   Socioeconomic History   Marital status: Single    Spouse name: Not on file   Number of children: Not on file   Years of education: Not on file   Highest education level: Not on file  Occupational History   Not on file  Tobacco Use   Smoking status: Never   Smokeless tobacco: Never  Vaping Use   Vaping Use: Never used  Substance and Sexual Activity   Alcohol use: Never   Drug use: Never   Sexual activity: Yes    Birth control/protection: None  Other Topics Concern   Not on file  Social History Narrative   Not on file   Social Determinants of Health   Financial Resource Strain: Not on file  Food Insecurity: No Food Insecurity   Worried About Running Out of Food in the Last Year: Never true   Ran Out of Food in the Last Year: Never true  Transportation Needs: No Transportation Needs   Lack of Transportation (Medical): No   Lack of Transportation (Non-Medical): No  Physical Activity: Not on file  Stress: Not on file  Social Connections: Not on file  Intimate Partner Violence:  Not on file   No current facility-administered medications on file prior to encounter.   Current Outpatient Medications on File Prior to Encounter  Medication Sig Dispense Refill   ondansetron (ZOFRAN ODT) 4 MG disintegrating tablet Take 1 tablet (4 mg total) by mouth every 6 (six) hours as needed for nausea. (Patient not taking: Reported on 11/24/2020) 20 tablet 0   Prenatal Vit-Fe Fumarate-FA (PREPLUS) 27-1 MG TABS Take 1 tablet by mouth daily.     promethazine (PHENERGAN) 25 MG tablet Take 1 tablet (25 mg total) by mouth every 6 (six) hours as needed for nausea or vomiting. (Patient not taking: Reported on 11/24/2020) 30 tablet 0   Allergies  Allergen Reactions   Albolene Swelling and Rash   Lactose Diarrhea and Other (See Comments)    constipation     ROS:  Review of Systems  Constitutional:  Negative for chills, fatigue and fever.  Eyes:  Negative for visual disturbance.  Respiratory:  Negative for shortness of breath.   Cardiovascular:  Negative for chest pain.  Gastrointestinal:  Positive for abdominal pain. Negative for nausea and vomiting.  Genitourinary:  Positive for vaginal discharge. Negative for difficulty urinating, dysuria, flank pain, pelvic pain, vaginal bleeding and vaginal pain.  Neurological:  Negative for dizziness and headaches.  Psychiatric/Behavioral: Negative.      I have reviewed patient's Past Medical Hx, Surgical Hx, Family Hx, Social Hx, medications and  allergies.   Physical Exam  Patient Vitals for the past 24 hrs:  BP Temp Temp src Pulse Resp SpO2 Height Weight  12/10/20 0930 111/70 98.1 F (36.7 C) Oral 84 16 100 % 5' (1.524 m) 56.1 kg   Constitutional: Well-developed, well-nourished female in no acute distress.  Cardiovascular: normal rate Respiratory: normal effort GI: Abd soft, non-tender. Pos BS x 4 MS: Extremities nontender, no edema, normal ROM Neurologic: Alert and oriented x 4.  GU: Neg CVAT.  PELVIC EXAM: vaginal cultures collected  by blind swab  FHT 145 by doppler  LAB RESULTS Results for orders placed or performed during the hospital encounter of 12/10/20 (from the past 24 hour(s))  Urinalysis, Routine w reflex microscopic Urine, Clean Catch     Status: Abnormal   Collection Time: 12/10/20  9:32 AM  Result Value Ref Range   Color, Urine STRAW (A) YELLOW   APPearance CLEAR CLEAR   Specific Gravity, Urine 1.004 (L) 1.005 - 1.030   pH 8.0 5.0 - 8.0   Glucose, UA NEGATIVE NEGATIVE mg/dL   Hgb urine dipstick NEGATIVE NEGATIVE   Bilirubin Urine NEGATIVE NEGATIVE   Ketones, ur NEGATIVE NEGATIVE mg/dL   Protein, ur NEGATIVE NEGATIVE mg/dL   Nitrite NEGATIVE NEGATIVE   Leukocytes,Ua SMALL (A) NEGATIVE   RBC / HPF 0-5 0 - 5 RBC/hpf   WBC, UA 0-5 0 - 5 WBC/hpf   Bacteria, UA NONE SEEN NONE SEEN   Squamous Epithelial / LPF 0-5 0 - 5    --/--/A POS (03/27 0127)  IMAGING No results found.   [place] MFM OB TRANSVAGINAL  Result Date: 12/10/2020 ----------------------------------------------------------------------  OBSTETRICS REPORT                       (Signed Final 12/10/2020 01:42 pm) ---------------------------------------------------------------------- Patient Info  ID #:       [id number]                          D.O.B.:  2000-11-21 (19 yrs)  Name:       Michelle Stewart                 Visit Date: 12/10/2020 12:19 pm ---------------------------------------------------------------------- Performed By  Attending:        Noralee Space MD        Ref. Address:     [address]                                                             Rd                                                             [place]y [place]  Performed By:     Marcellina Millin       [facility]  Referred By:      Wilmer Floor LEFTWICH-                    Craige Cotta CNM ---------------------------------------------------------------------- Orders  #  Description                            Code        Ordered By  1  [place] MFM OB LIMITED                     76815.01    Michelle Stewart LEFTWICH-                                                       KIRBY  2  [place] MFM OB TRANSVAGINAL                76817.2     Andrea Ferrer LEFTWICH-                                                       KIRBY ----------------------------------------------------------------------  #  Order #                     Accession #                Episode #  1  [id number]                   [id number]                 [id number]  2  [id number]                   [id number]                 [id number] ---------------------------------------------------------------------- Indications  [redacted] weeks gestation of pregnancy                Z3A.17  Encounter for cervical length                  Z36.86  Abdominal pain in pregnancy                    O99.89 ---------------------------------------------------------------------- Fetal Evaluation  Num Of Fetuses:         1  Fetal Heart Rate(bpm):  150  Cardiac Activity:       Observed  Presentation:           Breech  Placenta:               Anterior  Amniotic Fluid  AFI FV:      Within normal limits                              Largest Pocket(cm)                              6.88 ---------------------------------------------------------------------- Gestational Age  LMP:           18w 4d  Date:  08/02/20                 EDD:   05/09/21  Best:          Denyse Dago 3d     Det. ByMarcella Dubs         EDD:   05/17/21                                      (10/01/20) ---------------------------------------------------------------------- Cervix Uterus Adnexa  Cervix  Length:           3.04  cm.  Normal appearance by transvaginal scan  Uterus  No abnormality visualized.  Right Ovary  Not visualized.  Left Ovary  Not visualized.  Cul De Sac  Trace amount of free fluid seen.  Adnexa  No adnexal mass visualized. ---------------------------------------------------------------------- Impression  Patient is being evaluated the  MAU for painful abdominal  cramping.  A limited ultrasound study was performed.  Amniotic fluid is  normal and good fetal activity seen.  We performed a  transvaginal ultrasound to evaluate the cervix.  The cervix  measures 3 cm, which is within normal range. ----------------------------------------------------------------------                  Noralee Space, MD Electronically Signed Final Report   12/10/2020 01:42 pm ----------------------------------------------------------------------  [place] MFM OB Limited  Result Date: 12/10/2020 ----------------------------------------------------------------------  OBSTETRICS REPORT                       (Signed Final 12/10/2020 01:42 pm) ---------------------------------------------------------------------- Patient Info  ID #:       [id number]                          D.O.B.:  2000-07-06 (19 yrs)  Name:       Michelle Stewart                 Visit Date: 12/10/2020 12:19 pm ---------------------------------------------------------------------- Performed By  Attending:        Noralee Space MD        Ref. Address:     [address]                                                             Rd                                                             [place]y [place]  Performed By:     Marcellina Millin       [facility]  Referred By:  Jarett Dralle A LEFTWICH-                    KIRBY CNM ---------------------------------------------------------------------- Orders  #  Description                           Code        Ordered By  1  [place] MFM OB LIMITED                     76815.01    Kailyn Dubie LEFTWICH-                                                       KIRBY  2  [place] MFM OB TRANSVAGINAL                [id number]     Catlyn Shipton LEFTWICH-                                                       KIRBY ----------------------------------------------------------------------  #  Order #                      Accession #                Episode #  1  [id number]                   [id number]                 [id number]  2  [id number]                   [id number]                 [id number] ---------------------------------------------------------------------- Indications  [redacted] weeks gestation of pregnancy                Z3A.17  Encounter for cervical length                  Z36.86  Abdominal pain in pregnancy                    O99.89 ---------------------------------------------------------------------- Fetal Evaluation  Num Of Fetuses:         1  Fetal Heart Rate(bpm):  150  Cardiac Activity:       Observed  Presentation:           Breech  Placenta:               Anterior  Amniotic Fluid  AFI FV:      Within normal limits                              Largest Pocket(cm)                              6.88 ---------------------------------------------------------------------- Gestational Age  LMP:           18w 4d        Date:  08/02/20  EDD:   05/09/21  Best:          Denyse Dago 3d     Det. ByMarcella Dubs         EDD:   05/17/21                                      (10/01/20) ---------------------------------------------------------------------- Cervix Uterus Adnexa  Cervix  Length:           3.04  cm.  Normal appearance by transvaginal scan  Uterus  No abnormality visualized.  Right Ovary  Not visualized.  Left Ovary  Not visualized.  Cul De Sac  Trace amount of free fluid seen.  Adnexa  No adnexal mass visualized. ---------------------------------------------------------------------- Impression  Patient is being evaluated the MAU for painful abdominal  cramping.  A limited ultrasound study was performed.  Amniotic fluid is  normal and good fetal activity seen.  We performed a  transvaginal ultrasound to evaluate the cervix.  The cervix  measures 3 cm, which is within normal range. ----------------------------------------------------------------------                  Noralee Space, MD Electronically Signed Final  Report   12/10/2020 01:42 pm ----------------------------------------------------------------------   MAU Management/MDM: Orders Placed This Encounter  Procedures   Urinalysis, Routine w reflex microscopic Urine, Clean Catch    No orders of the defined types were placed in this encounter.   No acute abdomen, no evidence of shortening cervix or preterm labor. Urine sent for culture. Rest/ice/heat/warm bath/increase PO fluids/Tylenol/pregnancy support belt.  F/U in office, return to MAU for emergencies.    ASSESSMENT  1. Encounter for supervision of low-risk pregnancy in second trimester   2. Abdominal pain affecting pregnancy   3. [redacted] weeks gestation of pregnancy      PLAN Discharge home Allergies as of 12/10/2020       Reactions   Albolene Swelling, Rash   Lactose Diarrhea, Other (See Comments)   constipation        Medication List     TAKE these medications    ondansetron 4 MG disintegrating tablet Commonly known as: Zofran ODT Take 1 tablet (4 mg total) by mouth every 6 (six) hours as needed for nausea.   PrePLUS 27-1 MG Tabs Take 1 tablet by mouth daily.   promethazine 25 MG tablet Commonly known as: PHENERGAN Take 1 tablet (25 mg total) by mouth every 6 (six) hours as needed for nausea or vomiting.          Sharen Counter Certified Nurse-Midwife 12/10/2020  10:47 AM

## 2020-12-10 NOTE — MAU Note (Signed)
Michelle Stewart is a 20 y.o. at [redacted]w[redacted]d here in MAU reporting: lower abdominal pain since 0500 today. Pain is constant. Has not taken anything for the pain. No bleeding or LOF. Having increased vaginal discharge, unsure about odor. No itching.  Onset of complaint: today  Pain score: 7/10  Vitals:   12/10/20 0930  BP: 111/70  Pulse: 84  Resp: 16  Temp: 98.1 F (36.7 C)  SpO2: 100%     FHT:145  Lab orders placed from triage: UA

## 2020-12-11 LAB — GC/CHLAMYDIA PROBE AMP (~~LOC~~) NOT AT ARMC
Chlamydia: NEGATIVE
Comment: NEGATIVE
Comment: NORMAL
Neisseria Gonorrhea: NEGATIVE

## 2020-12-11 LAB — CULTURE, OB URINE

## 2020-12-21 ENCOUNTER — Inpatient Hospital Stay (HOSPITAL_COMMUNITY)
Admission: AD | Admit: 2020-12-21 | Discharge: 2020-12-21 | Disposition: A | Discharge status: Home / Self Care | Payer: Medicaid Other | Attending: Family Medicine | Admitting: Family Medicine

## 2020-12-21 ENCOUNTER — Inpatient Hospital Stay (HOSPITAL_BASED_OUTPATIENT_CLINIC_OR_DEPARTMENT_OTHER): Payer: Medicaid Other

## 2020-12-21 ENCOUNTER — Encounter (HOSPITAL_COMMUNITY): Payer: Self-pay | Admitting: Family Medicine

## 2020-12-21 ENCOUNTER — Other Ambulatory Visit: Payer: Self-pay

## 2020-12-21 DIAGNOSIS — O4690 Antepartum hemorrhage, unspecified, unspecified trimester: Secondary | ICD-10-CM

## 2020-12-21 DIAGNOSIS — R109 Unspecified abdominal pain: Secondary | ICD-10-CM | POA: Diagnosis not present

## 2020-12-21 DIAGNOSIS — O26892 Other specified pregnancy related conditions, second trimester: Secondary | ICD-10-CM | POA: Diagnosis not present

## 2020-12-21 DIAGNOSIS — O321XX0 Maternal care for breech presentation, not applicable or unspecified: Secondary | ICD-10-CM | POA: Diagnosis not present

## 2020-12-21 DIAGNOSIS — O468X2 Other antepartum hemorrhage, second trimester: Secondary | ICD-10-CM | POA: Insufficient documentation

## 2020-12-21 DIAGNOSIS — N930 Postcoital and contact bleeding: Secondary | ICD-10-CM | POA: Diagnosis not present

## 2020-12-21 DIAGNOSIS — N888 Other specified noninflammatory disorders of cervix uteri: Secondary | ICD-10-CM

## 2020-12-21 DIAGNOSIS — Z91048 Other nonmedicinal substance allergy status: Secondary | ICD-10-CM | POA: Diagnosis not present

## 2020-12-21 DIAGNOSIS — Z3A19 19 weeks gestation of pregnancy: Secondary | ICD-10-CM | POA: Insufficient documentation

## 2020-12-21 DIAGNOSIS — O4692 Antepartum hemorrhage, unspecified, second trimester: Secondary | ICD-10-CM | POA: Diagnosis not present

## 2020-12-21 DIAGNOSIS — Z3686 Encounter for antenatal screening for cervical length: Secondary | ICD-10-CM

## 2020-12-21 DIAGNOSIS — O99891 Other specified diseases and conditions complicating pregnancy: Secondary | ICD-10-CM | POA: Diagnosis not present

## 2020-12-21 LAB — URINALYSIS, ROUTINE W REFLEX MICROSCOPIC
Bacteria, UA: NONE SEEN
Bilirubin Urine: NEGATIVE
Glucose, UA: NEGATIVE mg/dL
Ketones, ur: 20 mg/dL — AB
Nitrite: NEGATIVE
Protein, ur: NEGATIVE mg/dL
Specific Gravity, Urine: 1.016 (ref 1.005–1.030)
pH: 7 (ref 5.0–8.0)

## 2020-12-21 NOTE — MAU Note (Signed)
Pt reports to mau with c/o vag bleeding that started about 30 min ago.  Pt reports intercourse earlier this morning.  Denies pain.  Denies vag itching or odor

## 2020-12-21 NOTE — MAU Provider Note (Signed)
History     CSN: [id number]  Arrival date and time: 12/21/20 1040   Event Date/Time   First Provider Initiated Contact with Patient 12/21/20 1114      Chief Complaint  Patient presents with   Vaginal Bleeding   HPI Michelle Stewart is a 20 y.o. G1P0000 at [redacted]w[redacted]d who presents with vaginal bleeding. She reports she had intercourse this am and immediately started bleeding. She reports it is heavy like a period. Since her arrival to MAU, it is now pink spotting. She denies any pain. She reports feeling the baby move.  OB History     Gravida  1   Para  0   Term  0   Preterm  0   AB  0   Living  0      SAB  0   IAB  0   Ectopic  0   Multiple  0   Live Births  0           History reviewed. No pertinent past medical history.  Past Surgical History:  Procedure Laterality Date   MANDIBLE SURGERY      History reviewed. No pertinent family history.  Social History   Tobacco Use   Smoking status: Never   Smokeless tobacco: Never  Vaping Use   Vaping Use: Never used  Substance Use Topics   Alcohol use: Never   Drug use: Never    Allergies:  Allergies  Allergen Reactions   Albolene Swelling and Rash   Lactose Diarrhea and Other (See Comments)    constipation     Medications Prior to Admission  Medication Sig Dispense Refill Last Dose   Prenatal Vit-Fe Fumarate-FA (PREPLUS) 27-1 MG TABS Take 1 tablet by mouth daily.   12/21/2020   ondansetron (ZOFRAN ODT) 4 MG disintegrating tablet Take 1 tablet (4 mg total) by mouth every 6 (six) hours as needed for nausea. 20 tablet 0    promethazine (PHENERGAN) 25 MG tablet Take 1 tablet (25 mg total) by mouth every 6 (six) hours as needed for nausea or vomiting. 30 tablet 0     Review of Systems  Constitutional: Negative.  Negative for fatigue and fever.  HENT: Negative.    Respiratory: Negative.  Negative for shortness of breath.   Cardiovascular: Negative.  Negative for chest pain.  Gastrointestinal:  Negative.  Negative for abdominal pain, constipation, diarrhea, nausea and vomiting.  Genitourinary:  Positive for vaginal bleeding. Negative for dysuria and vaginal discharge.  Neurological: Negative.  Negative for dizziness and headaches.  Physical Exam   Blood pressure 109/60, pulse 96, temperature 98 F (36.7 C), temperature source Oral, resp. rate 15, last menstrual period 08/02/2020, SpO2 100 %.  Physical Exam Vitals and nursing note reviewed.  Constitutional:      General: She is not in acute distress.    Appearance: She is well-developed.  HENT:     Head: Normocephalic.  Eyes:     Pupils: Pupils are equal, round, and reactive to light.  Cardiovascular:     Rate and Rhythm: Normal rate and regular rhythm.     Heart sounds: Normal heart sounds.  Pulmonary:     Effort: Pulmonary effort is normal. No respiratory distress.     Breath sounds: Normal breath sounds.  Abdominal:     General: Bowel sounds are normal. There is no distension.     Palpations: Abdomen is soft.     Tenderness: There is no abdominal tenderness.  Genitourinary:    Comments:  SSE: small amount of bright red blood with one clot in vault, cervix friable Skin:    General: Skin is warm and dry.  Neurological:     Mental Status: She is alert and oriented to person, place, and time.  Psychiatric:        Mood and Affect: Mood normal.        Behavior: Behavior normal.        Thought Content: Thought content normal.        Judgment: Judgment normal.   FHT: 153 bpm  MAU Course  Procedures Results for orders placed or performed during the hospital encounter of 12/21/20 (from the past 24 hour(s))  Urinalysis, Routine w reflex microscopic Urine, Clean Catch     Status: Abnormal   Collection Time: 12/21/20 11:00 AM  Result Value Ref Range   Color, Urine YELLOW YELLOW   APPearance CLEAR CLEAR   Specific Gravity, Urine 1.016 1.005 - 1.030   pH 7.0 5.0 - 8.0   Glucose, UA NEGATIVE NEGATIVE mg/dL   Hgb urine  dipstick MODERATE (A) NEGATIVE   Bilirubin Urine NEGATIVE NEGATIVE   Ketones, ur 20 (A) NEGATIVE mg/dL   Protein, ur NEGATIVE NEGATIVE mg/dL   Nitrite NEGATIVE NEGATIVE   Leukocytes,Ua MODERATE (A) NEGATIVE   RBC / HPF 11-20 0 - 5 RBC/hpf   WBC, UA 6-10 0 - 5 WBC/hpf   Bacteria, UA NONE SEEN NONE SEEN   Squamous Epithelial / LPF 0-5 0 - 5   Mucus PRESENT     MDM UA, UC [place] MFM OB Limited- no signs of abruption or previa, cervical length 3cm, normal AFI  Reassurance provided and normalcy of bleeding after intercourse discussed. Encouraged pelvic rest.  Assessment and Plan   1. Vaginal bleeding in pregnancy   2. Friable cervix   3. [redacted] weeks gestation of pregnancy   4. PCB (post coital bleeding)    -Discharge home in stable condition -Second trimester precautions discussed -Patient advised to follow-up with OB as scheduled for prenatal care -Patient may return to MAU as needed or if her condition were to change or worsen   Rolm Bookbinder CNM 12/21/2020, 11:14 AM

## 2020-12-21 NOTE — Discharge Instructions (Signed)

## 2020-12-22 ENCOUNTER — Other Ambulatory Visit: Payer: Self-pay | Admitting: Family Medicine

## 2020-12-22 ENCOUNTER — Ambulatory Visit: Payer: Medicaid Other | Attending: Family Medicine

## 2020-12-22 ENCOUNTER — Other Ambulatory Visit: Payer: Self-pay | Admitting: *Deleted

## 2020-12-22 DIAGNOSIS — Z363 Encounter for antenatal screening for malformations: Secondary | ICD-10-CM | POA: Diagnosis not present

## 2020-12-22 DIAGNOSIS — Z3490 Encounter for supervision of normal pregnancy, unspecified, unspecified trimester: Secondary | ICD-10-CM

## 2020-12-22 DIAGNOSIS — O358XX0 Maternal care for other (suspected) fetal abnormality and damage, not applicable or unspecified: Secondary | ICD-10-CM

## 2020-12-22 DIAGNOSIS — Z3A19 19 weeks gestation of pregnancy: Secondary | ICD-10-CM | POA: Diagnosis not present

## 2020-12-22 DIAGNOSIS — Z362 Encounter for other antenatal screening follow-up: Secondary | ICD-10-CM

## 2020-12-22 LAB — CULTURE, OB URINE: Culture: <10000 — AB

## 2020-12-23 ENCOUNTER — Encounter: Payer: Medicaid Other | Admitting: Student

## 2020-12-30 ENCOUNTER — Telehealth (INDEPENDENT_AMBULATORY_CARE_PROVIDER_SITE_OTHER): Payer: Medicaid Other | Admitting: Student

## 2020-12-30 ENCOUNTER — Other Ambulatory Visit: Payer: Self-pay

## 2020-12-30 VITALS — BP 99/71 | HR 92 | Wt 124.0 lb

## 2020-12-30 DIAGNOSIS — Z3492 Encounter for supervision of normal pregnancy, unspecified, second trimester: Secondary | ICD-10-CM

## 2020-12-30 DIAGNOSIS — Z3A20 20 weeks gestation of pregnancy: Secondary | ICD-10-CM

## 2020-12-30 NOTE — Progress Notes (Signed)
Patient ID: Michelle Stewart, female   DOB: 09/17/2000, 19 y.o.   MRN: [id number]   OBSTETRICS PRENATAL VIRTUAL VISIT ENCOUNTER NOTE  [facility]   Patient location: Home  I connected with Michelle PortaDestiny Eichel on 12/30/20 at  3:55 PM EDT by MyChart Video Encounter and verified that I am speaking with the correct person using two identifiers. I discussed the limitations, risks, security and privacy concerns of performing an evaluation and management service virtually and the availability of in person appointments. I also discussed with the patient that there may be a patient responsible charge related to this service. The patient expressed understanding and agreed to proceed. Subjective:  Michelle PortaDestiny Peake is a 20 y.o. G1P0000 at 1360w2d being seen today for ongoing prenatal care.  She is currently monitored for the following issues for this low-risk pregnancy and has Adjustment disorder with mixed disturbance of emotions and conduct and Supervision of low-risk pregnancy on their problem list.  Patient reports no complaints. She denies nausea, vomiting. She is concerned she is not gaining enough weight.   Contractions: Not present. Vag. Bleeding: None.  Movement: Present. Denies any leaking of fluid.   The following portions of the patient's history were reviewed and updated as appropriate: allergies, current medications, past family history, past medical history, past social history, past surgical history and problem list.   Objective:   Vitals:   12/30/20 1601  BP: 99/71  Pulse: 92  Weight: 124 lb (56.2 kg)    Fetal Status:     Movement: Present     General:  Alert, oriented and cooperative. Patient is in no acute distress.  Respiratory: Normal respiratory effort, no problems with respiration noted  Mental Status: Normal mood and affect. Normal behavior. Normal judgment and thought content.  Rest of physical exam deferred due to type of  encounter  Imaging: US MFM OB TRANSVAGINAL  Result Date: 12/10/2020 ----------------------------------------------------------------------  OBSTETRICS REPORT                       (Signed Final 12/10/2020 01:42 pm) ---------------------------------------------------------------------- Patient Info  ID #:       [id number]                          D.O.B.:  08-Jun-2001 (19 yrs)  Name:       Michelle PortaESTINY Bobak                 Visit Date: 12/10/2020 12:19 pm ---------------------------------------------------------------------- Performed By  Attending:        Noralee Spaceavi Shankar MD        Ref. Address[address]     [address]                                                             [address]                                                             [place]  Performed By:     Marcellina MillinKatherine Speake  [facility]  Referred By:      Wilmer Floor LEFTWICH-                    KIRBY CNM ---------------------------------------------------------------------- Orders  #  Description                           Code        Ordered By  1  [place] MFM OB LIMITED                     76815.01    LISA LEFTWICH-                                                       KIRBY  2  [place] MFM OB TRANSVAGINAL                76817.2     LISA LEFTWICH-                                                       KIRBY ----------------------------------------------------------------------  #  Order #                     Accession #                Episode #  1  [id number]                   [id number]                 [id number]  2  [id number]                   [id number]                 [id number] ---------------------------------------------------------------------- Indications  [redacted] weeks gestation of pregnancy                Z3A.17  Encounter for cervical length                  Z36.86  Abdominal pain in pregnancy                    O99.89  ---------------------------------------------------------------------- Fetal Evaluation  Num Of Fetuses:         1  Fetal Heart Rate(bpm):  150  Cardiac Activity:       Observed  Presentation:           Breech  Placenta:               Anterior  Amniotic Fluid  AFI FV:      Within normal limits  Largest Pocket(cm)                              6.88 ---------------------------------------------------------------------- Gestational Age  LMP:           18w 4d        Date:  08/02/20                 EDD:   05/09/21  Best:          Denyse Dago 3d     Det. ByMarcella Dubs         EDD:   05/17/21                                      (10/01/20) ---------------------------------------------------------------------- Cervix Uterus Adnexa  Cervix  Length:           3.04  cm.  Normal appearance by transvaginal scan  Uterus  No abnormality visualized.  Right Ovary  Not visualized.  Left Ovary  Not visualized.  Cul De Sac  Trace amount of free fluid seen.  Adnexa  No adnexal mass visualized. ---------------------------------------------------------------------- Impression  Patient is being evaluated the MAU for painful abdominal  cramping.  A limited ultrasound study was performed.  Amniotic fluid is  normal and good fetal activity seen.  We performed a  transvaginal ultrasound to evaluate the cervix.  The cervix  measures 3 cm, which is within normal range. ----------------------------------------------------------------------                  Noralee Space, MD Electronically Signed Final Report   12/10/2020 01:42 pm ----------------------------------------------------------------------  [place] MFM OB DETAIL +14 WK  Result Date: 12/22/2020 ----------------------------------------------------------------------  OBSTETRICS REPORT                       (Signed Final 12/22/2020 04:13 pm) ---------------------------------------------------------------------- Patient Info  ID #:       [id number]                           D.O.B.:  May 14, 2001 (19 yrs)  Name:       Michelle Porta                 Visit Date: 12/22/2020 02:17 pm ---------------------------------------------------------------------- Performed By  Attending:        Ma Rings MD         Ref. Address:     [address]                                                             [address]                                                             [employer]  Performed By:     Tommie Raymond BS,       [facility]  Referred By:      Wilmer Floor LEFTWICH-                    Craige Cotta CNM ---------------------------------------------------------------------- Orders  #  Description                           Code        Ordered By  1  [place] MFM OB DETAIL +14 WK               76811.01    MATTHEW ECKSTAT ----------------------------------------------------------------------  #  Order #                     Accession #                Episode #  1  [id number]                   [id number]                 [id number] ---------------------------------------------------------------------- Indications  Echogenic intracardiac focus of the heart      O35.8XX0  (EIF)  [redacted] weeks gestation of pregnancy                Z3A.19  Encounter for antenatal screening for          Z36.3  malformations  LR NIPS/ Negative Horizon/ Negative AFP ---------------------------------------------------------------------- Fetal Evaluation  Num Of Fetuses:         1  Fetal Heart Rate(bpm):  129  Cardiac Activity:       Observed  Presentation:           Cephalic  Placenta:               Anterior  P. Cord Insertion:      Previously Visualized  Amniotic Fluid  AFI FV:      Within normal limits                              Largest Pocket(cm)                              5.7  ---------------------------------------------------------------------- Biometry  BPD:      44.4  mm     G. Age:  19w 3d         63  %    CI:  73.54   %    70 - 86                                                          FL/HC:      17.7   %    16.1 - 18.3  HC:      164.5  mm     G. Age:  19w 1d         44  %    HC/AC:      1.16        1.09 - 1.39  AC:      142.1  mm     G. Age:  19w 4d         60  %    FL/BPD:     65.5   %  FL:       29.1  mm     G. Age:  19w 0d         36  %    FL/AC:      20.5   %    20 - 24  HUM:      27.8  mm     G. Age:  18w 6d         45  %  CER:      19.9  mm     G. Age:  19w 2d         47  %  NFT:       4.0  mm  LV:        7.4  mm  CM:        4.5  mm  Est. FW:     284  gm    0 lb 10 oz      54  % ---------------------------------------------------------------------- Gestational Age  LMP:           20w 2d        Date:  08/02/20                 EDD:   05/09/21  U/S Today:     19w 2d                                        EDD:   05/16/21  Best:          19w 1d     Det. ByMarcella Dubs         EDD:   05/17/21                                      (10/01/20) ---------------------------------------------------------------------- Anatomy  Cranium:               Appears normal         LVOT:                   Not well visualized  Cavum:                 Appears normal         Aortic Arch:  Appears normal  Ventricles:            Appears normal         Ductal Arch:            Not well visualized  Choroid Plexus:        Appears normal         Diaphragm:              Appears normal  Cerebellum:            Appears normal         Stomach:                Appears normal, left                                                                        sided  Posterior Fossa:       Appears normal         Abdomen:                Appears normal  Nuchal Fold:           Appears normal         Abdominal Wall:         Appears nml (cord                                                                         insert, abd wall)  Face:                  Orbits nl; profile not Cord Vessels:           Appears normal (3                         well visualized                                vessel cord)  Lips:                  Appears normal         Kidneys:                Appear normal  Palate:                Not well visualized    Bladder:                Appears normal  Thoracic:              Appears normal         Spine:                  Appears normal  Heart:                 Not well visualized:   Upper Extremities:      Appears  normal                         EIF in LV  RVOT:                  Not well visualized    Lower Extremities:      Appears normal  Other:  Fetus appears to be female. Heels/feet and open hands/5th digits          visualized. Technically difficult due to fetal position. ---------------------------------------------------------------------- Cervix Uterus Adnexa  Cervix  Length:           3.34  cm.  Normal appearance by transabdominal scan.  Uterus  No abnormality visualized.  Right Ovary  Within normal limits.  Left Ovary  Within normal limits.  Cul De Sac  No free fluid seen.  Adnexa  No abnormality visualized. ---------------------------------------------------------------------- Comments  This patient was seen for a detailed fetal anatomy scan.  She denies any significant past medical history and denies  any problems in her current pregnancy.  She had a cell free DNA test earlier in her pregnancy which  indicated a low risk for trisomy 69, 25, and 13. A female fetus  is predicted.  She was informed that the fetal growth and amniotic fluid  level were appropriate for her gestational age.  On today's exam, an intracardiac echogenic focus was noted  in the left ventricle of the fetal heart.  The small association  between an echogenic focus and Down syndrome was  discussed. Due to the echogenic focus noted today, the  patient was offered and declined an amniocentesis today for  definitive diagnosis  of fetal aneuploidy.  She reports that she  is comfortable with her negative cell free DNA test.  The patient was informed that anomalies may be missed due  to technical limitations. If the fetus is in a suboptimal position  or maternal habitus is increased, visualization of the fetus in  the maternal uterus may be impaired.  A follow-up exam was scheduled in 4 weeks to reassess the  views of the fetal anatomy and to follow the fetal growth. ----------------------------------------------------------------------                   Ma Rings, MD Electronically Signed Final Report   12/22/2020 04:13 pm ----------------------------------------------------------------------  [place] MFM OB Limited  Result Date: 12/21/2020 ----------------------------------------------------------------------  OBSTETRICS REPORT                       (Signed Final 12/21/2020 01:11 pm) ---------------------------------------------------------------------- Patient Info  ID #:       [id number]                          D.O.B.:  2000-11-22 (19 yrs)  Name:       Michelle Porta                 Visit Date: 12/21/2020 11:27 am ---------------------------------------------------------------------- Performed By  Attending:        Noralee Space MD        Ref. Address:     [address]                                                             [place]  Performed By:     Birdena Crandall        [facility]  Referred By:      Hurshel Party CNM ---------------------------------------------------------------------- Orders  #  Description                           Code        Ordered By  1  [place] MFM OB LIMITED                     [phone]    CAROLINE NEILL ----------------------------------------------------------------------  #  Order #                     Accession #                 Episode #  1  [id number]                   [id number]                 [id number] ---------------------------------------------------------------------- Indications  Vaginal bleeding in pregnancy, second          O46.92  trimester  [redacted] weeks gestation of pregnancy                Z3A.19  Encounter for cervical length                  Z36.86  Abdominal pain in pregnancy                    O99.89 ---------------------------------------------------------------------- Fetal Evaluation  Num Of Fetuses:         1  Fetal Heart Rate(bpm):  140  Cardiac Activity:       Observed  Presentation:           Breech  Placenta:               Anterior  P. Cord Insertion:      Visualized, central  Amniotic Fluid  AFI FV:      Within normal limits                              Largest Pocket(cm)                              4.9  Comment:    No placental abruption or previa identified. ---------------------------------------------------------------------- Gestational Age  LMP:  20w 1d        Date:  08/02/20                 EDD:   05/09/21  Best:          Toney Rakes 0d     Det. By:  Marcella Dubs         EDD:   05/17/21                                      (10/01/20) ---------------------------------------------------------------------- Anatomy  Cranium:               Appears normal         Diaphragm:              Appears normal  Cavum:                 Appears normal         Stomach:                Appears normal, left                                                                        sided  Ventricles:            Appears normal         Abdomen:                Appears normal  Choroid Plexus:        Appears normal         Abdominal Wall:         Appears nml (cord                                                                        insert, abd wall)  Cerebellum:            Appears normal         Cord Vessels:           Appears normal (3                                                                        vessel cord)   Posterior Fossa:       Appears normal         Kidneys:                Appear normal  Heart:                 Appears normal         Bladder:  Appears normal                         (4CH, axis, and                         situs)  Aortic Arch:           Appears normal         Lower Extremities:      Visualized  Ductal Arch:           Appears normal ---------------------------------------------------------------------- Cervix Uterus Adnexa  Cervix  Length:              3  cm.  Normal appearance by transabdominal scan.  Uterus  No abnormality visualized. ---------------------------------------------------------------------- Impression  Patient was evaluated for c/o vaginal bleeding .  A limited ultrasound study was performed .Amniotic fluid is  normal and good fetal activity is seen . Placenta appears  normal with no evidence of previa.  On transabdominal scan, the cervix looks long and closed . ----------------------------------------------------------------------                  Noralee Space, MD Electronically Signed Final Report   12/21/2020 01:11 pm ----------------------------------------------------------------------  [place] MFM OB Limited  Result Date: 12/10/2020 ----------------------------------------------------------------------  OBSTETRICS REPORT                       (Signed Final 12/10/2020 01:42 pm) ---------------------------------------------------------------------- Patient Info  ID #:       [id number]                          D.O.B.:  Aug 21, 2000 (19 yrs)  Name:       Michelle Porta                 Visit Date: 12/10/2020 12:19 pm ---------------------------------------------------------------------- Performed By  Attending:        Noralee Space MD        Ref. Address:     [address]                                                             [address]d                                                             [place]  Performed By:     Marcellina Millin       [facility]  Referred By:      Wilmer Floor LEFTWICH-                    KIRBY  CNM ---------------------------------------------------------------------- Orders  #  Description                           Code        Ordered By  1  [place] MFM OB LIMITED                     76815.01    LISA LEFTWICH-                                                       KIRBY  2  [place] MFM OB TRANSVAGINAL                76817.2     LISA LEFTWICH-                                                       KIRBY ----------------------------------------------------------------------  #  Order #                     Accession #                Episode #  1  [id number]                   [id number]                 [id number]  2  [id number]                   [id number]                 [id number] ---------------------------------------------------------------------- Indications  [redacted] weeks gestation of pregnancy                Z3A.17  Encounter for cervical length                  Z36.86  Abdominal pain in pregnancy                    O99.89 ---------------------------------------------------------------------- Fetal Evaluation  Num Of Fetuses:         1  Fetal Heart Rate(bpm):  150  Cardiac Activity:       Observed  Presentation:           Breech  Placenta:               Anterior  Amniotic Fluid  AFI FV:      Within normal limits                              Largest Pocket(cm)                              6.88 ---------------------------------------------------------------------- Gestational Age  LMP:           18w 4d        Date:  08/02/20                 EDD:   05/09/21  Best:  17w 3d     Det. ByMarcella Dubs         EDD:   05/17/21                                      (10/01/20) ---------------------------------------------------------------------- Cervix Uterus Adnexa  Cervix  Length:           3.04  cm.  Normal appearance by transvaginal scan  Uterus  No abnormality visualized.   Right Ovary  Not visualized.  Left Ovary  Not visualized.  Cul De Sac  Trace amount of free fluid seen.  Adnexa  No adnexal mass visualized. ---------------------------------------------------------------------- Impression  Patient is being evaluated the MAU for painful abdominal  cramping.  A limited ultrasound study was performed.  Amniotic fluid is  normal and good fetal activity seen.  We performed a  transvaginal ultrasound to evaluate the cervix.  The cervix  measures 3 cm, which is within normal range. ----------------------------------------------------------------------                  Noralee Space, MD Electronically Signed Final Report   12/10/2020 01:42 pm ----------------------------------------------------------------------   Assessment and Plan:  Pregnancy: G1P0000 at [redacted]w[redacted]d 1. Encounter for supervision of low-risk pregnancy in second trimester -will do Pap Partum -discussed weight gain and reviewed normal results -discussed follow up for EIF; patient states that she is "not worried about it" and feels comfortable with the genetic testing.   Preterm labor symptoms and general obstetric precautions including but not limited to vaginal bleeding, contractions, leaking of fluid and fetal movement were reviewed in detail with the patient. I discussed the assessment and treatment plan with the patient. The patient was provided an opportunity to ask questions and all were answered. The patient agreed with the plan and demonstrated an understanding of the instructions. The patient was advised to call back or seek an in-person office evaluation/go to MAU at [facility] for any urgent or concerning symptoms. Please refer to After Visit Summary for other counseling recommendations.   I provided 15 minutes of face-to-face time during this encounter.  Return in about 4 weeks (around 01/27/2021), or LROB in person.  Future Appointments  Date Time Provider Department Center   01/21/2021  3:00 PM [facility] NURSE [facility]  01/21/2021  3:15 PM [facility] US2 [facility]    Samara Deist Gena Fray, CNM [facility] [employer], [facility]

## 2020-12-30 NOTE — Progress Notes (Signed)
Patient stated that she has been having "ligamnet pains and a lot of headaches"

## 2020-12-30 NOTE — Progress Notes (Signed)
I connected with  Arlis Porta on 12/30/20 at  3:55 PM EDT by MyChart Virtual Video Visit and verified that I am speaking with the correct person using two identifiers.   I discussed the limitations, risks, security and privacy concerns of performing an evaluation and management service by telephone and the availability of in person appointments. I also discussed with the patient that there may be a patient responsible charge related to this service. The patient expressed understanding and agreed to proceed.  Guy Begin, CMA 12/30/2020  3:59 PM

## 2021-01-03 ENCOUNTER — Inpatient Hospital Stay (HOSPITAL_COMMUNITY)
Admission: AD | Admit: 2021-01-03 | Discharge: 2021-01-03 | Disposition: A | Discharge status: Home / Self Care | Payer: Medicaid Other | Attending: Obstetrics & Gynecology | Admitting: Obstetrics & Gynecology

## 2021-01-03 ENCOUNTER — Encounter (HOSPITAL_COMMUNITY): Payer: Self-pay | Admitting: Obstetrics & Gynecology

## 2021-01-03 ENCOUNTER — Other Ambulatory Visit: Payer: Self-pay

## 2021-01-03 DIAGNOSIS — O26892 Other specified pregnancy related conditions, second trimester: Secondary | ICD-10-CM | POA: Insufficient documentation

## 2021-01-03 DIAGNOSIS — O98812 Other maternal infectious and parasitic diseases complicating pregnancy, second trimester: Secondary | ICD-10-CM | POA: Insufficient documentation

## 2021-01-03 DIAGNOSIS — B373 Candidiasis of vulva and vagina: Secondary | ICD-10-CM | POA: Diagnosis not present

## 2021-01-03 DIAGNOSIS — Z3A20 20 weeks gestation of pregnancy: Secondary | ICD-10-CM

## 2021-01-03 DIAGNOSIS — R102 Pelvic and perineal pain: Secondary | ICD-10-CM | POA: Insufficient documentation

## 2021-01-03 DIAGNOSIS — M545 Low back pain, unspecified: Secondary | ICD-10-CM | POA: Insufficient documentation

## 2021-01-03 DIAGNOSIS — B3731 Acute candidiasis of vulva and vagina: Secondary | ICD-10-CM

## 2021-01-03 DIAGNOSIS — Z79899 Other long term (current) drug therapy: Secondary | ICD-10-CM | POA: Diagnosis not present

## 2021-01-03 LAB — URINALYSIS, ROUTINE W REFLEX MICROSCOPIC
Bilirubin Urine: NEGATIVE
Glucose, UA: NEGATIVE mg/dL
Hgb urine dipstick: NEGATIVE
Ketones, ur: 80 mg/dL — AB
Nitrite: NEGATIVE
Protein, ur: NEGATIVE mg/dL
Specific Gravity, Urine: 1.015 (ref 1.005–1.030)
pH: 6 (ref 5.0–8.0)

## 2021-01-03 LAB — WET PREP, GENITAL
Clue Cells Wet Prep HPF POC: NONE SEEN
Trich, Wet Prep: NONE SEEN

## 2021-01-03 MED ORDER — TERCONAZOLE 0.4 % VA CREA: 1.0000 | TOPICAL_CREAM | Freq: Every day | VAGINAL | 0 refills | Status: DC

## 2021-01-03 NOTE — MAU Provider Note (Signed)
History     CSN: [id number]  Arrival date and time: 01/03/21 0101   Event Date/Time   First Provider Initiated Contact with Patient 01/03/21 0239      Chief Complaint  Patient presents with   Abdominal Pain   [name] is a 20 y.o. year old G45P0000 female at [redacted]w[redacted]d weeks gestation who presents to MAU reporting lower abdominal cramping and lower back pain x 2 hours. She denies VB, vaginal irritation, abnormal vaginal discharge, or ROM. She reports (+) FM. She denies dysuria, fever, N/V/D. She has not taken any medication for the pain. She receives her [facility] with [facility].   OB History     Gravida  1   Para  0   Term  0   Preterm  0   AB  0   Living  0      SAB  0   IAB  0   Ectopic  0   Multiple  0   Live Births  0           History reviewed. No pertinent past medical history.  Past Surgical History:  Procedure Laterality Date   MANDIBLE SURGERY      History reviewed. No pertinent family history.  Social History   Tobacco Use   Smoking status: Never   Smokeless tobacco: Never  Vaping Use   Vaping Use: Never used  Substance Use Topics   Alcohol use: Never   Drug use: Never    Allergies:  Allergies  Allergen Reactions   Albolene Swelling and Rash   Lactose Diarrhea and Other (See Comments)    constipation     Medications Prior to Admission  Medication Sig Dispense Refill Last Dose   ondansetron (ZOFRAN ODT) 4 MG disintegrating tablet Take 1 tablet (4 mg total) by mouth every 6 (six) hours as needed for nausea. (Patient not taking: Reported on 12/30/2020) 20 tablet 0    Prenatal Vit-Fe Fumarate-FA (PREPLUS) 27-1 MG TABS Take 1 tablet by mouth daily.   01/01/2021   promethazine (PHENERGAN) 25 MG tablet Take 1 tablet (25 mg total) by mouth every 6 (six) hours as needed for nausea or vomiting. (Patient not taking: Reported on 12/30/2020) 30 tablet 0     Review of Systems  Constitutional: Negative.   HENT: Negative.    Eyes:  Negative.   Respiratory: Negative.    Cardiovascular: Negative.   Gastrointestinal: Negative.   Endocrine: Negative.   Genitourinary:  Positive for pelvic pain (x 2 hours).  Musculoskeletal:  Positive for back pain.  Allergic/Immunologic: Negative.   Neurological: Negative.   Hematological: Negative.   Psychiatric/Behavioral: Negative.    Physical Exam   Blood pressure 94/60, pulse (!) 101, temperature 98.5 F (36.9 C), resp. rate 16, height 5\' 1"  (1.549 m), weight 55.8 kg, last menstrual period 08/02/2020, SpO2 99 %.  Physical Exam Vitals and nursing note reviewed. Exam conducted with a chaperone present.  Constitutional:      Appearance: Normal appearance. She is normal weight.  Cardiovascular:     Rate and Rhythm: Normal rate.     Pulses: Normal pulses.  Pulmonary:     Effort: Pulmonary effort is normal.  Abdominal:     General: There is no distension.     Palpations: Abdomen is soft.     Tenderness: There is abdominal tenderness.  Genitourinary:    General: Normal vulva.     Comments: Pelvic exam: External genitalia normal, SE: vaginal walls pink and well rugated, cervix  is smooth, pink, no lesions, moderate amt of thick, curdy, yellow vaginal d/c -- WP, GC/CT done, cervix visually closed, Uterus is non-tender, S=D, no CMT or friability, no adnexal tenderness.  Musculoskeletal:     Cervical back: Normal range of motion.  Skin:    General: Skin is warm and dry.  Neurological:     Mental Status: She is alert and oriented to person, place, and time.  Psychiatric:        Mood and Affect: Mood normal.        Behavior: Behavior normal.        Thought Content: Thought content normal.        Judgment: Judgment normal.    MAU Course  Procedures  MDM CCUA UCx -- Results pending  Wet Prep  Results for orders placed or performed during the hospital encounter of 01/03/21 (from the past 24 hour(s))  Urinalysis, Routine w reflex microscopic Urine, Clean Catch     Status:  Abnormal   Collection Time: 01/03/21  1:18 AM  Result Value Ref Range   Color, Urine YELLOW YELLOW   APPearance HAZY (A) CLEAR   Specific Gravity, Urine 1.015 1.005 - 1.030   pH 6.0 5.0 - 8.0   Glucose, UA NEGATIVE NEGATIVE mg/dL   Hgb urine dipstick NEGATIVE NEGATIVE   Bilirubin Urine NEGATIVE NEGATIVE   Ketones, ur 80 (A) NEGATIVE mg/dL   Protein, ur NEGATIVE NEGATIVE mg/dL   Nitrite NEGATIVE NEGATIVE   Leukocytes,Ua LARGE (A) NEGATIVE   RBC / HPF 0-5 0 - 5 RBC/hpf   WBC, UA 11-20 0 - 5 WBC/hpf   Bacteria, UA FEW (A) NONE SEEN   Squamous Epithelial / LPF 0-5 0 - 5   Mucus PRESENT    Sperm, UA PRESENT   Wet prep, genital     Status: Abnormal   Collection Time: 01/03/21  2:48 AM  Result Value Ref Range   Yeast Wet Prep HPF POC PRESENT (A) NONE SEEN   Trich, Wet Prep NONE SEEN NONE SEEN   Clue Cells Wet Prep HPF POC NONE SEEN NONE SEEN   WBC, Wet Prep HPF POC MANY (A) NONE SEEN   Sperm PRESENT     Assessment and Plan  Pelvic pain affecting pregnancy in second trimester, antepartum  - Advised to take Tylenol 1000 mg prn 6 hr prn - Information provided on abdominal pain in pregnancy   [redacted] weeks gestation of pregnancy  Candida vaginitis  - Rx for Terazol use vaginally hs x 7 days -- MyChart message sent to patient to notify of results and Rx  - Discharge patient - Keep scheduled appts with [facility] - Patient verbalized an understanding of the plan of care and agrees.    Raelyn Mora, CNM 01/03/2021, 2:39 AM

## 2021-01-03 NOTE — MAU Note (Signed)
Pt reports lower abd cramping and lower back pain x 2 hours. Denies bleeding or ROM. Positive fetal movement.

## 2021-01-04 LAB — CULTURE, OB URINE: Culture: <10000 — AB

## 2021-01-08 ENCOUNTER — Telehealth: Payer: Self-pay

## 2021-01-08 ENCOUNTER — Ambulatory Visit
Admission: EM | Admit: 2021-01-08 | Discharge: 2021-01-08 | Disposition: A | Discharge status: Home / Self Care | Payer: Medicaid Other | Attending: Urgent Care | Admitting: Urgent Care

## 2021-01-08 DIAGNOSIS — Z3A21 21 weeks gestation of pregnancy: Secondary | ICD-10-CM | POA: Diagnosis not present

## 2021-01-08 DIAGNOSIS — B373 Candidiasis of vulva and vagina: Secondary | ICD-10-CM | POA: Diagnosis not present

## 2021-01-08 DIAGNOSIS — O2312 Infections of bladder in pregnancy, second trimester: Secondary | ICD-10-CM

## 2021-01-08 DIAGNOSIS — R300 Dysuria: Secondary | ICD-10-CM | POA: Insufficient documentation

## 2021-01-08 DIAGNOSIS — N3000 Acute cystitis without hematuria: Secondary | ICD-10-CM | POA: Diagnosis not present

## 2021-01-08 DIAGNOSIS — Z7251 High risk heterosexual behavior: Secondary | ICD-10-CM

## 2021-01-08 DIAGNOSIS — B3731 Acute candidiasis of vulva and vagina: Secondary | ICD-10-CM

## 2021-01-08 DIAGNOSIS — O23592 Infection of other part of genital tract in pregnancy, second trimester: Secondary | ICD-10-CM | POA: Diagnosis not present

## 2021-01-08 LAB — POCT URINALYSIS DIP (MANUAL ENTRY)
Bilirubin, UA: NEGATIVE
Blood, UA: NEGATIVE
Glucose, UA: NEGATIVE mg/dL
Nitrite, UA: NEGATIVE
Spec Grav, UA: >=1.03 — AB (ref 1.010–1.025)
Urobilinogen, UA: 0.2 E.U./dL
pH, UA: 6.5 (ref 5.0–8.0)

## 2021-01-08 MED ORDER — CEPHALEXIN 500 MG PO CAPS: 500.0000 mg | ORAL_CAPSULE | Freq: Two times a day (BID) | ORAL | 0 refills | Status: DC

## 2021-01-08 MED ORDER — PREPLUS 27-1 MG PO TABS: 1.0000 | ORAL_TABLET | Freq: Every day | ORAL | 0 refills | Status: DC

## 2021-01-08 NOTE — ED Provider Notes (Signed)
[facility]   MRN: [id number] DOB: 14-May-2001  Subjective:   Michelle Stewart is a 20 y.o. female presenting for 1 week history of persistent genital symptoms.  Patient is [redacted] weeks pregnant, had a visit with her OB on 01/03/2021, had trouble with pelvic pain and discharge.  She was diagnosed with a yeast infection and prescribed terconazole.  She tested negative for trichomoniasis.  Patient would like a recheck including gonorrhea, chlamydia.  She is sexually active, does not use condoms for protection.  Since starting the terconazole, reports that she feels dysuria, urinary frequency and lower pelvic pain when she urinates.  Denies fever, nausea, vomiting, vaginal bleeding.  Patient hydrates very well, limits urinary irritants.  Drinks about 6 bottles of water daily.  No current facility-administered medications for this encounter.  Current Outpatient Medications:    ondansetron (ZOFRAN ODT) 4 MG disintegrating tablet, Take 1 tablet (4 mg total) by mouth every 6 (six) hours as needed for nausea. (Patient not taking: Reported on 12/30/2020), Disp: 20 tablet, Rfl: 0   Prenatal Vit-Fe Fumarate-FA (PREPLUS) 27-1 MG TABS, Take 1 tablet by mouth daily., Disp: , Rfl:    promethazine (PHENERGAN) 25 MG tablet, Take 1 tablet (25 mg total) by mouth every 6 (six) hours as needed for nausea or vomiting. (Patient not taking: Reported on 12/30/2020), Disp: 30 tablet, Rfl: 0   terconazole (TERAZOL 7) 0.4 % vaginal cream, Place 1 applicator vaginally at bedtime., Disp: 45 g, Rfl: 0   Allergies  Allergen Reactions   Albolene Swelling and Rash   Lactose Diarrhea and Other (See Comments)    constipation     History reviewed. No pertinent past medical history.   Past Surgical History:  Procedure Laterality Date   MANDIBLE SURGERY      History reviewed. No pertinent family history.  Social History   Tobacco Use   Smoking status: Never   Smokeless tobacco: Never  Vaping Use   Vaping  Use: Never used  Substance Use Topics   Alcohol use: Never   Drug use: Never    ROS   Objective:   Vitals: BP 98/67 (BP Location: Left Arm)   Pulse 97   Temp 98.1 F (36.7 C) (Oral)   Resp 15   LMP 08/02/2020   SpO2 99%   Physical Exam Constitutional:      General: She is not in acute distress.    Appearance: Normal appearance. She is well-developed. She is not ill-appearing, toxic-appearing or diaphoretic.  HENT:     Head: Normocephalic and atraumatic.     Nose: Nose normal.     Mouth/Throat:     Mouth: Mucous membranes are moist.     Pharynx: Oropharynx is clear.  Eyes:     General: No scleral icterus.       Right eye: No discharge.        Left eye: No discharge.     Extraocular Movements: Extraocular movements intact.     Conjunctiva/sclera: Conjunctivae normal.     Pupils: Pupils are equal, round, and reactive to light.  Cardiovascular:     Rate and Rhythm: Normal rate.  Pulmonary:     Effort: Pulmonary effort is normal.  Abdominal:     General: There is no distension.     Palpations: There is no mass.     Tenderness: There is abdominal tenderness (suprapubic). There is no right CVA tenderness, left CVA tenderness, guarding or rebound.  Skin:    General: Skin is warm and dry.  Neurological:     General: No focal deficit present.     Mental Status: She is alert and oriented to person, place, and time.  Psychiatric:        Mood and Affect: Mood normal.        Behavior: Behavior normal.        Thought Content: Thought content normal.        Judgment: Judgment normal.    Results for orders placed or performed during the hospital encounter of 01/08/21 (from the past 24 hour(s))  POCT urinalysis dipstick     Status: Abnormal   Collection Time: 01/08/21  3:18 PM  Result Value Ref Range   Color, UA yellow yellow   Clarity, UA hazy (A) clear   Glucose, UA negative negative mg/dL   Bilirubin, UA negative negative   Ketones, POC UA small (15) (A) negative  mg/dL   Spec Grav, UA >=8.469 (A) 1.010 - 1.025   Blood, UA negative negative   pH, UA 6.5 5.0 - 8.0   Protein Ur, POC trace (A) negative mg/dL   Urobilinogen, UA 0.2 0.2 or 1.0 E.U./dL   Nitrite, UA Negative Negative   Leukocytes, UA Small (1+) (A) Negative    Assessment and Plan :   PDMP not reviewed this encounter.  1. Acute cystitis without hematuria   2. Dysuria   3. Yeast vaginitis   4. Unprotected sex   5. [redacted] weeks gestation of pregnancy     Start Keflex to cover for acute cystitis, urine culture pending.  Recommended aggressive hydration, limiting urinary irritants.  Maintain terconazole.  We will treat as appropriate otherwise based off of STI results.  Counseled patient on potential for adverse effects with medications prescribed/recommended today, ER and return-to-clinic precautions discussed, patient verbalized understanding.    Wallis Bamberg, [place] 01/08/21 [phone]

## 2021-01-08 NOTE — Discharge Instructions (Addendum)

## 2021-01-08 NOTE — ED Triage Notes (Signed)
Pt states recently dx with yeast infection. Given tx, states symptoms are worst since starting medication. Burning sensation while voiding and abd pain.

## 2021-01-09 DIAGNOSIS — H40033 Anatomical narrow angle, bilateral: Secondary | ICD-10-CM | POA: Diagnosis not present

## 2021-01-09 DIAGNOSIS — H5203 Hypermetropia, bilateral: Secondary | ICD-10-CM | POA: Diagnosis not present

## 2021-01-09 LAB — CERVICOVAGINAL ANCILLARY ONLY
Bacterial Vaginitis (gardnerella): NEGATIVE
Candida Glabrata: NEGATIVE
Candida Vaginitis: NEGATIVE
Chlamydia: NEGATIVE
Comment: NEGATIVE
Comment: NEGATIVE
Comment: NEGATIVE
Comment: NEGATIVE
Comment: NEGATIVE
Comment: NORMAL
Neisseria Gonorrhea: NEGATIVE
Trichomonas: NEGATIVE

## 2021-01-10 DIAGNOSIS — H43393 Other vitreous opacities, bilateral: Secondary | ICD-10-CM | POA: Diagnosis not present

## 2021-01-10 DIAGNOSIS — H5213 Myopia, bilateral: Secondary | ICD-10-CM | POA: Diagnosis not present

## 2021-01-10 LAB — URINE CULTURE

## 2021-01-13 ENCOUNTER — Encounter (HOSPITAL_COMMUNITY): Payer: Self-pay | Admitting: Obstetrics & Gynecology

## 2021-01-13 ENCOUNTER — Inpatient Hospital Stay (HOSPITAL_COMMUNITY): Payer: Medicaid Other

## 2021-01-13 ENCOUNTER — Other Ambulatory Visit: Payer: Self-pay

## 2021-01-13 ENCOUNTER — Inpatient Hospital Stay (HOSPITAL_COMMUNITY)
Admission: AD | Admit: 2021-01-13 | Discharge: 2021-01-13 | Disposition: A | Discharge status: Home / Self Care | Payer: Medicaid Other | Attending: Obstetrics & Gynecology | Admitting: Obstetrics & Gynecology

## 2021-01-13 DIAGNOSIS — R509 Fever, unspecified: Secondary | ICD-10-CM | POA: Diagnosis not present

## 2021-01-13 DIAGNOSIS — R519 Headache, unspecified: Secondary | ICD-10-CM | POA: Insufficient documentation

## 2021-01-13 DIAGNOSIS — U071 COVID-19: Secondary | ICD-10-CM | POA: Diagnosis not present

## 2021-01-13 DIAGNOSIS — Z79899 Other long term (current) drug therapy: Secondary | ICD-10-CM | POA: Diagnosis not present

## 2021-01-13 DIAGNOSIS — Z3A22 22 weeks gestation of pregnancy: Secondary | ICD-10-CM | POA: Diagnosis not present

## 2021-01-13 DIAGNOSIS — O98512 Other viral diseases complicating pregnancy, second trimester: Secondary | ICD-10-CM | POA: Insufficient documentation

## 2021-01-13 DIAGNOSIS — R079 Chest pain, unspecified: Secondary | ICD-10-CM | POA: Diagnosis not present

## 2021-01-13 DIAGNOSIS — J029 Acute pharyngitis, unspecified: Secondary | ICD-10-CM | POA: Insufficient documentation

## 2021-01-13 DIAGNOSIS — O26892 Other specified pregnancy related conditions, second trimester: Secondary | ICD-10-CM | POA: Diagnosis not present

## 2021-01-13 LAB — URINALYSIS, ROUTINE W REFLEX MICROSCOPIC
Bilirubin Urine: NEGATIVE
Glucose, UA: NEGATIVE mg/dL
Hgb urine dipstick: NEGATIVE
Ketones, ur: 5 mg/dL — AB
Nitrite: NEGATIVE
Protein, ur: NEGATIVE mg/dL
Specific Gravity, Urine: 1.025 (ref 1.005–1.030)
pH: 6 (ref 5.0–8.0)

## 2021-01-13 LAB — CBC WITH DIFFERENTIAL/PLATELET
Abs Immature Granulocytes: 0.04 10*3/uL (ref 0.00–0.07)
Basophils Absolute: 0 10*3/uL (ref 0.0–0.1)
Basophils Relative: 0 %
Eosinophils Absolute: 0.2 10*3/uL (ref 0.0–0.5)
Eosinophils Relative: 2 %
HCT: 31.6 % — ABNORMAL LOW (ref 36.0–46.0)
Hemoglobin: 11.6 g/dL — ABNORMAL LOW (ref 12.0–15.0)
Immature Granulocytes: 0 %
Lymphocytes Relative: 5 %
Lymphs Abs: 0.5 10*3/uL — ABNORMAL LOW (ref 0.7–4.0)
MCH: 34.5 pg — ABNORMAL HIGH (ref 26.0–34.0)
MCHC: 36.7 g/dL — ABNORMAL HIGH (ref 30.0–36.0)
MCV: 94 fL (ref 80.0–100.0)
Monocytes Absolute: 1.3 10*3/uL — ABNORMAL HIGH (ref 0.1–1.0)
Monocytes Relative: 12 %
Neutro Abs: 8.7 10*3/uL — ABNORMAL HIGH (ref 1.7–7.7)
Neutrophils Relative %: 81 %
Platelets: 186 10*3/uL (ref 150–400)
RBC: 3.36 MIL/uL — ABNORMAL LOW (ref 3.87–5.11)
RDW: 12.4 % (ref 11.5–15.5)
WBC: 10.7 10*3/uL — ABNORMAL HIGH (ref 4.0–10.5)
nRBC: 0 % (ref 0.0–0.2)

## 2021-01-13 LAB — RESP PANEL BY RT-PCR (FLU A&B, COVID) ARPGX2
Influenza A by PCR: NEGATIVE
Influenza B by PCR: NEGATIVE
SARS Coronavirus 2 by RT PCR: POSITIVE — AB

## 2021-01-13 MED ORDER — ACETAMINOPHEN 500 MG PO TABS
1000.0000 mg | ORAL_TABLET | Freq: Once | ORAL | Status: AC
  Administered 2021-01-13: 1000 mg via ORAL
  Filled 2021-01-13: qty 2

## 2021-01-13 MED ORDER — MENTHOL 3 MG MT LOZG: 1.0000 | LOZENGE | OROMUCOSAL | 12 refills | Status: DC | PRN

## 2021-01-13 MED ORDER — DEXTROMETHORPHAN POLISTIREX ER 30 MG/5ML PO SUER
30.0000 mg | Freq: Once | ORAL | Status: AC
  Administered 2021-01-13: 30 mg via ORAL
  Filled 2021-01-13: qty 5

## 2021-01-13 MED ORDER — LACTATED RINGERS IV BOLUS
1000.0000 mL | Freq: Once | INTRAVENOUS | Status: AC
  Administered 2021-01-13: 1000 mL via INTRAVENOUS

## 2021-01-13 MED ORDER — MENTHOL 3 MG MT LOZG
1.0000 | LOZENGE | OROMUCOSAL | Status: DC | PRN
  Filled 2021-01-13: qty 9

## 2021-01-13 NOTE — Progress Notes (Signed)
Patient declined throat lozenges at this time. Patient resting comfortably. Call bell within reach.

## 2021-01-13 NOTE — MAU Provider Note (Signed)
Chief Complaint:  Fever, chest congestion, and Headache   Event Date/Time   First Provider Initiated Contact with Patient 01/13/21 [phone]     HPI: Michelle Stewart is a 20 y.o. G1P0000 at [redacted]w[redacted]d who presents to maternity admissions reporting cough that started two days ago followed by fever (up to 103.1, last dose of Tylenol at 7pm last night), chest congestion with occasional chest pain during a bad coughing fit, headache, sore throat and fatigue. She denies loss of taste/smell, any sick contacts, n/v/d, vaginal bleeding, leaking of fluid or decreased fetal movement. Has not taken anything for her cough. Took 2 Covid tests at home, both were negative. Has not been eating/drinking much because of her sore throat and "it feels like I'll choke on it because I'm always coughing."  Pregnancy Course: Receives care at [facility], prenatal records reviewed.  History reviewed. No pertinent past medical history. OB History  Gravida Para Term Preterm AB Living  1 0 0 0 0 0  SAB IAB Ectopic Multiple Live Births  0 0 0 0 0    # Outcome Date GA Lbr Len/2nd Weight Sex Delivery Anes PTL Lv  1 Current            Past Surgical History:  Procedure Laterality Date   MANDIBLE SURGERY     History reviewed. No pertinent family history. Social History   Tobacco Use   Smoking status: Never   Smokeless tobacco: Never  Vaping Use   Vaping Use: Never used  Substance Use Topics   Alcohol use: Never   Drug use: Never   Allergies  Allergen Reactions   Albolene Swelling and Rash   Lactose Diarrhea and Other (See Comments)    constipation    Medications Prior to Admission  Medication Sig Dispense Refill Last Dose   cephALEXin (KEFLEX) 500 MG capsule Take 1 capsule (500 mg total) by mouth 2 (two) times daily. 14 capsule 0 01/12/2021   ondansetron (ZOFRAN ODT) 4 MG disintegrating tablet Take 1 tablet (4 mg total) by mouth every 6 (six) hours as needed for nausea. (Patient not taking: No sig reported) 20 tablet 0  Unknown   Prenatal Vit-Fe Fumarate-FA (PREPLUS) 27-1 MG TABS Take 1 tablet by mouth daily. 30 tablet 0 01/11/2021   promethazine (PHENERGAN) 25 MG tablet Take 1 tablet (25 mg total) by mouth every 6 (six) hours as needed for nausea or vomiting. (Patient not taking: No sig reported) 30 tablet 0 Unknown   terconazole (TERAZOL 7) 0.4 % vaginal cream Place 1 applicator vaginally at bedtime. 45 g 0    I have reviewed patient's Past Medical Hx, Surgical Hx, Family Hx, Social Hx, medications and allergies.   ROS:  Pertinent items noted in HPI and remainder of comprehensive ROS otherwise negative.  Physical Exam  Patient Vitals for the past 24 hrs:  BP Temp Temp src Pulse Resp SpO2 Height Weight  01/13/21 0530 112/74 99.4 F (37.4 C) Oral (!) 130 20 100 % 5\' 1"  (1.549 m) 122 lb (55.3 kg)   Constitutional: Well-developed, well-nourished female in mild distress.  Cardiovascular: tachycardic but normal rhythm, no murmur Respiratory: normal effort, lung sounds clear throughout GI: Abd soft, non-tender, gravid appropriate for gestational age. Pos BS x 4 MS: Extremities nontender, no edema, normal ROM Neurologic: Alert and oriented x 4.  GU: no CVA tenderness Pelvic exam deferred  FHR: 151   Labs: Results for orders placed or performed during the hospital encounter of 01/13/21 (from the past 24 hour(s))  Urinalysis,  Routine w reflex microscopic Urine, Clean Catch     Status: Abnormal   Collection Time: 01/13/21  5:44 AM  Result Value Ref Range   Color, Urine YELLOW YELLOW   APPearance HAZY (A) CLEAR   Specific Gravity, Urine 1.025 1.005 - 1.030   pH 6.0 5.0 - 8.0   Glucose, UA NEGATIVE NEGATIVE mg/dL   Hgb urine dipstick NEGATIVE NEGATIVE   Bilirubin Urine NEGATIVE NEGATIVE   Ketones, ur 5 (A) NEGATIVE mg/dL   Protein, ur NEGATIVE NEGATIVE mg/dL   Nitrite NEGATIVE NEGATIVE   Leukocytes,Ua MODERATE (A) NEGATIVE   RBC / HPF 0-5 0 - 5 RBC/hpf   WBC, UA 21-50 0 - 5 WBC/hpf   Bacteria, UA  RARE (A) NONE SEEN   Squamous Epithelial / LPF 0-5 0 - 5   Mucus PRESENT   Resp Panel by RT-PCR (Flu A&B, Covid) Nasopharyngeal Swab     Status: Abnormal   Collection Time: 01/13/21  5:45 AM   Specimen: Nasopharyngeal Swab; Nasopharyngeal(NP) swabs in vial transport medium  Result Value Ref Range   SARS Coronavirus 2 by RT PCR POSITIVE (A) NEGATIVE   Influenza A by PCR NEGATIVE NEGATIVE   Influenza B by PCR NEGATIVE NEGATIVE  CBC with Differential/Platelet     Status: Abnormal   Collection Time: 01/13/21  6:48 AM  Result Value Ref Range   WBC 10.7 (H) 4.0 - 10.5 K/uL   RBC 3.36 (L) 3.87 - 5.11 MIL/uL   Hemoglobin 11.6 (L) 12.0 - 15.0 g/dL   HCT 09.2 (L) 33.0 - 07.6 %   MCV 94.0 80.0 - 100.0 fL   MCH 34.5 (H) 26.0 - 34.0 pg   MCHC 36.7 (H) 30.0 - 36.0 g/dL   RDW 22.6 33.3 - 54.5 %   Platelets 186 150 - 400 K/uL   nRBC 0.0 0.0 - 0.2 %   Neutrophils Relative % 81 %   Neutro Abs 8.7 (H) 1.7 - 7.7 K/uL   Lymphocytes Relative 5 %   Lymphs Abs 0.5 (L) 0.7 - 4.0 K/uL   Monocytes Relative 12 %   Monocytes Absolute 1.3 (H) 0.1 - 1.0 K/uL   Eosinophils Relative 2 %   Eosinophils Absolute 0.2 0.0 - 0.5 K/uL   Basophils Relative 0 %   Basophils Absolute 0.0 0.0 - 0.1 K/uL   Immature Granulocytes 0 %   Abs Immature Granulocytes 0.04 0.00 - 0.07 K/uL    Imaging:  DG CHEST PORT 1 VIEW  Result Date: 01/13/2021 CLINICAL DATA:  20 year old female with history of fever, chest congestion and headache. EXAM: PORTABLE CHEST 1 VIEW COMPARISON:  No priors. FINDINGS: Lung volumes are normal. No consolidative airspace disease. No pleural effusions. No pneumothorax. No pulmonary nodule or mass noted. Pulmonary vasculature and the cardiomediastinal silhouette are within normal limits. IMPRESSION: No radiographic evidence of acute cardiopulmonary disease. Electronically Signed   By: Trudie Reed M.D.   On: 01/13/2021 06:39    MAU Course: Orders Placed This Encounter  Procedures   Resp Panel by  RT-PCR (Flu A&B, Covid) Nasopharyngeal Swab   DG CHEST PORT 1 VIEW   Urinalysis, Routine w reflex microscopic Urine, Clean Catch   CBC with Differential/Platelet   Airborne and Contact precautions   Discharge patient   Meds ordered this encounter  Medications   lactated ringers bolus 1,000 mL   acetaminophen (TYLENOL) tablet 1,000 mg   dextromethorphan (DELSYM) 30 MG/5ML liquid 30 mg   menthol-cetylpyridinium (CEPACOL) lozenge 3 mg   MDM: Respiratory panel  and bloodwork collected - Covid swab positive  LR bolus + delsym and tylenol given with some relief of symptoms, pt remains fatigued.  Reviewed results of positive COVID test with patient. Discussed typical course of virus and what to expect. Informed patient that symptoms tend to worsen on days 5-8 and reviewed safe medications for symptom management. Warning signs of when to return to MAU reviewed at length including shortness of breath, chest pain and decreased fetal movement. Instructed patient to quarantine for 10 days and any office appointments will be changed to virtual or rescheduled to outside of the quarantine window. Urgent message sent to [facility].  Safe med list in AVS.  Assessment: 1. COVID-19 affecting pregnancy in second trimester   2. Chest pain    Plan: Discharge home in stable condition with return precautions as listed above.     Follow-up Information     [facility]. Go to.   Specialty: Obstetrics and Gynecology Why: as scheduled for ongoing prenatal care - if in-person appointment within the next 10 days, will be switched to virtual. Contact information: [address] [place] [address] [phone]                Edd Arbour, CNM, MSN, [facility] Certified Nurse Midwife, [facility]

## 2021-01-13 NOTE — MAU Note (Signed)
Pt reports she started running a fever yesterday (103.0 last night), chest congestion and headache. Home covid test were negative.

## 2021-01-13 NOTE — Discharge Instructions (Signed)

## 2021-01-18 DIAGNOSIS — O26892 Other specified pregnancy related conditions, second trimester: Secondary | ICD-10-CM | POA: Diagnosis not present

## 2021-01-18 DIAGNOSIS — N898 Other specified noninflammatory disorders of vagina: Secondary | ICD-10-CM | POA: Diagnosis not present

## 2021-01-18 DIAGNOSIS — Z114 Encounter for screening for human immunodeficiency virus [HIV]: Secondary | ICD-10-CM | POA: Diagnosis not present

## 2021-01-18 DIAGNOSIS — R300 Dysuria: Secondary | ICD-10-CM | POA: Diagnosis not present

## 2021-01-18 DIAGNOSIS — B373 Candidiasis of vulva and vagina: Secondary | ICD-10-CM | POA: Diagnosis not present

## 2021-01-19 ENCOUNTER — Ambulatory Visit
Admission: EM | Admit: 2021-01-19 | Discharge: 2021-01-19 | Disposition: A | Discharge status: Home / Self Care | Payer: Medicaid Other

## 2021-01-19 DIAGNOSIS — U071 COVID-19: Secondary | ICD-10-CM | POA: Diagnosis not present

## 2021-01-19 DIAGNOSIS — Z113 Encounter for screening for infections with a predominantly sexual mode of transmission: Secondary | ICD-10-CM | POA: Diagnosis not present

## 2021-01-19 NOTE — ED Triage Notes (Signed)
Pt presents with need for work note. Reports she was diagnosed with covid on 8/2. She started having symptoms on 7/31. Pt has continued cough but no fever x 1 week. She is [redacted] weeks pregnant. Her job will not accept her work note she has.

## 2021-01-19 NOTE — ED Provider Notes (Signed)
[facility]    CSN: [id number] Arrival date & time: 01/19/21  1005      History   Chief Complaint Chief Complaint  Patient presents with   Follow-up    HPI Michelle Stewart is a 20 y.o. female.   Patient presents requesting work note after positive COVID test on 8/2. Symptoms began on 7/31, with only non productive cough present. Denies fever, chills, headache, body aches, sore throat, congestion, shortness of breath, abdominal pain. Unvaccinated.    History reviewed. No pertinent past medical history.  Patient Active Problem List   Diagnosis Date Noted   Supervision of low-risk pregnancy 10/08/2020   Adjustment disorder with mixed disturbance of emotions and conduct 04/20/2018    Past Surgical History:  Procedure Laterality Date   MANDIBLE SURGERY      OB History     Gravida  1   Para  0   Term  0   Preterm  0   AB  0   Living  0      SAB  0   IAB  0   Ectopic  0   Multiple  0   Live Births  0            Home Medications    Prior to Admission medications   Medication Sig Start Date End Date Taking? Authorizing Provider  cephALEXin (KEFLEX) 500 MG capsule Take 1 capsule (500 mg total) by mouth 2 (two) times daily. 01/08/21   Wallis Bamberg, PA-C  Prenatal Vit-Fe Fumarate-FA (PREPLUS) 27-1 MG TABS Take 1 tablet by mouth daily. 01/08/21   Wallis Bamberg, PA-C  terconazole (TERAZOL 7) 0.4 % vaginal cream Place 1 applicator vaginally at bedtime. 01/03/21   Raelyn Mora, CNM    Family History Family History  Problem Relation Age of Onset   Healthy Mother    Healthy Father     Social History Social History   Tobacco Use   Smoking status: Never   Smokeless tobacco: Never  Vaping Use   Vaping Use: Never used  Substance Use Topics   Alcohol use: Never   Drug use: Never     Allergies   Albolene and Lactose   Review of Systems Review of Systems  Constitutional: Negative.   HENT: Negative.    Respiratory:  Positive for  cough. Negative for apnea, choking, chest tightness, shortness of breath, wheezing and stridor.   Cardiovascular: Negative.   Gastrointestinal: Negative.   Skin: Negative.   Neurological: Negative.     Physical Exam Triage Vital Signs ED Triage Vitals  Enc Vitals Group     BP 01/19/21 1145 100/63     Pulse Rate 01/19/21 1145 82     Resp 01/19/21 1145 17     Temp 01/19/21 1145 97.8 F (36.6 C)     Temp src --      SpO2 01/19/21 1145 98 %     Weight --      Height --      Head Circumference --      Peak Flow --      Pain Score 01/19/21 1144 0     Pain Loc --      Pain Edu? --      Excl. in GC? --    No data found.  Updated Vital Signs BP 100/63   Pulse 82   Temp 97.8 F (36.6 C)   Resp 17   LMP 08/02/2020   SpO2 98%   Visual Acuity Right  Eye Distance:   Left Eye Distance:   Bilateral Distance:    Right Eye Near:   Left Eye Near:    Bilateral Near:     Physical Exam Constitutional:      Appearance: Normal appearance. She is normal weight.  Eyes:     Extraocular Movements: Extraocular movements intact.  Cardiovascular:     Rate and Rhythm: Normal rate and regular rhythm.     Pulses: Normal pulses.     Heart sounds: Normal heart sounds.  Pulmonary:     Effort: Pulmonary effort is normal.     Breath sounds: Normal breath sounds.  Skin:    General: Skin is warm and dry.  Neurological:     Mental Status: She is oriented to person, place, and time. Mental status is at baseline.  Psychiatric:        Mood and Affect: Mood normal.        Behavior: Behavior normal.     UC Treatments / Results  Labs (all labs ordered are listed, but only abnormal results are displayed) Labs Reviewed - No data to display  EKG   Radiology No results found.  Procedures Procedures (including critical care time)  Medications Ordered in UC Medications - No data to display  Initial Impression / Assessment and Plan / UC Course  I have reviewed the triage vital signs  and the nursing notes.  Pertinent labs & imaging results that were available during my care of the patient were reviewed by me and considered in my medical decision making (see chart for details).  COVID  Work note given for return on 01/22/21 per 10- day quarantine recommendations from [facility] Declined treatment for cough Final Clinical Impressions(s) / UC Diagnoses   Final diagnoses:  COVID   Discharge Instructions   None    ED Prescriptions   None    PDMP not reviewed this encounter.   Valinda Hoar, NP 01/19/21 1230

## 2021-01-21 ENCOUNTER — Encounter: Payer: Self-pay | Admitting: *Deleted

## 2021-01-21 ENCOUNTER — Ambulatory Visit: Payer: Medicaid Other | Admitting: *Deleted

## 2021-01-21 ENCOUNTER — Ambulatory Visit: Payer: Medicaid Other | Attending: Obstetrics

## 2021-01-21 ENCOUNTER — Other Ambulatory Visit: Payer: Self-pay

## 2021-01-21 VITALS — BP 96/69 | HR 98 | Wt 121.6 lb

## 2021-01-21 DIAGNOSIS — Z3A23 23 weeks gestation of pregnancy: Secondary | ICD-10-CM

## 2021-01-21 DIAGNOSIS — O283 Abnormal ultrasonic finding on antenatal screening of mother: Secondary | ICD-10-CM | POA: Insufficient documentation

## 2021-01-21 DIAGNOSIS — Z362 Encounter for other antenatal screening follow-up: Secondary | ICD-10-CM | POA: Insufficient documentation

## 2021-01-21 DIAGNOSIS — Z363 Encounter for antenatal screening for malformations: Secondary | ICD-10-CM | POA: Diagnosis not present

## 2021-01-21 DIAGNOSIS — O358XX0 Maternal care for other (suspected) fetal abnormality and damage, not applicable or unspecified: Secondary | ICD-10-CM

## 2021-01-21 NOTE — Progress Notes (Signed)
Voices concern about" no weight gain"-last documented weight 124 lbs on 12/30/20. Weight today 121.6 lbs.

## 2021-01-28 DIAGNOSIS — H1013 Acute atopic conjunctivitis, bilateral: Secondary | ICD-10-CM | POA: Diagnosis not present

## 2021-01-28 DIAGNOSIS — H5203 Hypermetropia, bilateral: Secondary | ICD-10-CM | POA: Diagnosis not present

## 2021-02-02 ENCOUNTER — Encounter (HOSPITAL_COMMUNITY): Payer: Self-pay | Admitting: Obstetrics and Gynecology

## 2021-02-02 ENCOUNTER — Inpatient Hospital Stay (HOSPITAL_COMMUNITY)
Admission: AD | Admit: 2021-02-02 | Discharge: 2021-02-02 | Disposition: A | Discharge status: Home / Self Care | Payer: Medicaid Other | Attending: Obstetrics and Gynecology | Admitting: Obstetrics and Gynecology

## 2021-02-02 ENCOUNTER — Other Ambulatory Visit: Payer: Self-pay

## 2021-02-02 DIAGNOSIS — Z87440 Personal history of urinary (tract) infections: Secondary | ICD-10-CM | POA: Diagnosis not present

## 2021-02-02 DIAGNOSIS — O2340 Unspecified infection of urinary tract in pregnancy, unspecified trimester: Secondary | ICD-10-CM

## 2021-02-02 DIAGNOSIS — B379 Candidiasis, unspecified: Secondary | ICD-10-CM

## 2021-02-02 DIAGNOSIS — O26892 Other specified pregnancy related conditions, second trimester: Secondary | ICD-10-CM | POA: Diagnosis not present

## 2021-02-02 DIAGNOSIS — Z3A25 25 weeks gestation of pregnancy: Secondary | ICD-10-CM | POA: Diagnosis not present

## 2021-02-02 DIAGNOSIS — R102 Pelvic and perineal pain: Secondary | ICD-10-CM | POA: Diagnosis not present

## 2021-02-02 LAB — WET PREP, GENITAL
Clue Cells Wet Prep HPF POC: NONE SEEN
Sperm: NONE SEEN
Trich, Wet Prep: NONE SEEN

## 2021-02-02 LAB — URINALYSIS, ROUTINE W REFLEX MICROSCOPIC
Bilirubin Urine: NEGATIVE
Glucose, UA: NEGATIVE mg/dL
Hgb urine dipstick: NEGATIVE
Ketones, ur: 20 mg/dL — AB
Nitrite: NEGATIVE
Protein, ur: NEGATIVE mg/dL
Specific Gravity, Urine: 1.032 — ABNORMAL HIGH (ref 1.005–1.030)
pH: 6 (ref 5.0–8.0)

## 2021-02-02 MED ORDER — CLOTRIMAZOLE 1 % VA CREA
1.0000 | TOPICAL_CREAM | Freq: Every day | VAGINAL | Status: DC
  Filled 2021-02-02: qty 45

## 2021-02-02 MED ORDER — CLOTRIMAZOLE 1 % VA CREA: 1.0000 | TOPICAL_CREAM | Freq: Every day | VAGINAL | 2 refills | Status: AC

## 2021-02-02 NOTE — MAU Note (Signed)
..  Michelle Stewart is a 20 y.o. at [redacted]w[redacted]d here in MAU reporting: Ongoing UTI symptoms. She states she was diagnosed with a UTI in March and has had her antibiotics switched 4 times but none of them are helping. Reports hematuria and dysuria. As well as constant abdominal and back pain but is unsure if this is due to the UTI or to her baby getting bigger. The pain gets worse when she moves, walks, or bends.  Pain score: 10/10 Vitals:   02/02/21 2056 02/02/21 2057  BP:  109/61  Pulse:  (!) 101  Resp:  16  Temp:  98 F (36.7 C)  SpO2: 100%      FHT:143 Lab orders placed from triage: UA

## 2021-02-02 NOTE — Discharge Instructions (Signed)
Follow up in the office for STD testing results and urine culture results.

## 2021-02-02 NOTE — MAU Provider Note (Addendum)
History  [id number]  Arrival date and time: 02/02/21 2035    Chief Complaint  Patient presents with   Dysuria   Hematuria   Abdominal Pain     HPI Michelle Stewart is a 20 y.o. at [redacted]w[redacted]d who presents for possible recurrent UTI. She has had intermittent symptoms of pressure, urinary frequency and hematuria. She sometimes has pain with voiding. She has had intermittently positive Urine cultures but often times she is treated and final cx is negative.   Vaginal bleeding: No LOF: No Fetal Movement: Yes Contractions: No   Review of discharge summary from last admission on MAU on 01/13/21:   Review of records from Care Everywhere: pan sensitive E. Coli UTI on 01/18/21 that is s/p cefotetan   --/--/A POS (03/27 0127)  OB History     Gravida  1   Para  0   Term  0   Preterm  0   AB  0   Living  0      SAB  0   IAB  0   Ectopic  0   Multiple  0   Live Births  0           History reviewed. No pertinent past medical history.  Past Surgical History:  Procedure Laterality Date   MANDIBLE SURGERY      Family History  Problem Relation Age of Onset   Healthy Mother    Healthy Father     Social History   Socioeconomic History   Marital status: Single    Spouse name: Not on file   Number of children: Not on file   Years of education: Not on file   Highest education level: Not on file  Occupational History   Not on file  Tobacco Use   Smoking status: Never   Smokeless tobacco: Never  Vaping Use   Vaping Use: Never used  Substance and Sexual Activity   Alcohol use: Never   Drug use: Never   Sexual activity: Yes    Birth control/protection: None  Other Topics Concern   Not on file  Social History Narrative   Not on file   Social Determinants of Health   Financial Resource Strain: Not on file  Food Insecurity: No Food Insecurity   Worried About Running Out of Food in the Last Year: Never true   Ran Out of Food in the Last Year: Never true   Transportation Needs: No Transportation Needs   Lack of Transportation (Medical): No   Lack of Transportation (Non-Medical): No  Physical Activity: Not on file  Stress: Not on file  Social Connections: Not on file  Intimate Partner Violence: Not on file    Allergies  Allergen Reactions   Albolene Swelling and Rash   Lactose Diarrhea and Other (See Comments)    constipation     No current facility-administered medications on file prior to encounter.   Current Outpatient Medications on File Prior to Encounter  Medication Sig Dispense Refill   Prenatal Vit-Fe Fumarate-FA (PREPLUS) 27-1 MG TABS Take 1 tablet by mouth daily. 30 tablet 0   cephALEXin (KEFLEX) 500 MG capsule Take 1 capsule (500 mg total) by mouth 2 (two) times daily. 14 capsule 0   terconazole (TERAZOL 7) 0.4 % vaginal cream Place 1 applicator vaginally at bedtime. (Patient not taking: Reported on 01/21/2021) 45 g 0     ROS Pertinent positives and negative per HPI, all others reviewed and negative  Physical Exam   BP 109/61 (  BP Location: Right Arm)   Pulse (!) 101   Temp 98 F (36.7 C) (Oral)   Resp 16   Ht 5\' 1"  (1.549 m)   Wt 57.3 kg   LMP 08/02/2020   SpO2 100%   BMI 23.88 kg/m   Physical Exam  Cervical Exam Dilation: Closed Cervical Position: Posterior  Abdomen: soft, fundus nontender, nonspecifc pelvic tenderness  FHT Baseline 150, mod variability, present accels, absent decels Toco: One ctxn Cat: reactive  Labs Results for orders placed or performed during the hospital encounter of 02/02/21 (from the past 24 hour(s))  Urinalysis, Routine w reflex microscopic Urine, Clean Catch     Status: Abnormal   Collection Time: 02/02/21 10:03 PM  Result Value Ref Range   Color, Urine YELLOW YELLOW   APPearance HAZY (A) CLEAR   Specific Gravity, Urine 1.032 (H) 1.005 - 1.030   pH 6.0 5.0 - 8.0   Glucose, UA NEGATIVE NEGATIVE mg/dL   Hgb urine dipstick NEGATIVE NEGATIVE   Bilirubin Urine NEGATIVE  NEGATIVE   Ketones, ur 20 (A) NEGATIVE mg/dL   Protein, ur NEGATIVE NEGATIVE mg/dL   Nitrite NEGATIVE NEGATIVE   Leukocytes,Ua LARGE (A) NEGATIVE   RBC / HPF 0-5 0 - 5 RBC/hpf   WBC, UA 11-20 0 - 5 WBC/hpf   Bacteria, UA FEW (A) NONE SEEN   Squamous Epithelial / LPF 11-20 0 - 5   Mucus PRESENT     Imaging No results found.  MAU Course  Procedures Lab Orders         Wet prep, genital         Urinalysis, Routine w reflex microscopic Urine, Clean Catch    No orders of the defined types were placed in this encounter.  Imaging Orders  No imaging studies ordered today     Assessment and Plan   1. Pelvic pressure in pregnancy, antepartum, second trimester    - UA - advised waiting for urine culture given recent positive and mixed results. UA is not definitive. - Wet prep - positive for yeast - will send home clotrimazole  - GC/CT collected per pt request, Informed results will take 2 days - Counseling on PTL precautions but reassured by cervical exam.  - f/u on 9/1 as scheduled  11/1, MD

## 2021-02-03 LAB — GC/CHLAMYDIA PROBE AMP (~~LOC~~) NOT AT ARMC
Chlamydia: NEGATIVE
Comment: NEGATIVE
Comment: NORMAL
Neisseria Gonorrhea: NEGATIVE

## 2021-02-04 ENCOUNTER — Telehealth: Payer: Self-pay | Admitting: Family Medicine

## 2021-02-04 NOTE — Telephone Encounter (Signed)
Pt called again this afternoon. Spoke with pt. Pt states feels " like something is wrong with my baby" pt is currently 25wk 3 days pregnant. Pt states went to MAU on 8/22 to be seen for pelvic pain and was found to have UTI. Pt states having lower abd pain and pressure that is the same as at MAU. +FM everyday. Denies any vaginal bleeding or severe abd pain. Pt is currently taking abx for UTI and is worried it will cause damage to baby. Pt reassured the abx that she is on currently is ok and safe for her baby. Pt advised to increase water intake and take all meds prescribed for UTI. Pt agreeable and verbalized understanding.  Pt has next appt at [facility] on 9/1. Pt advised to return to MAU with worsening symptoms or change in FM or vaginal bleeding. Pt agreeable to plan of care.   Judeth Cornfield, RN

## 2021-02-04 NOTE — Telephone Encounter (Signed)
Patient called , she said she is having a lot of cramping and she was also seen in the ED a few days ago, she said it took a long time to find the baby heart beat, she want a nurse from this office to call her, said she did not want to return to the ED.

## 2021-02-12 ENCOUNTER — Encounter: Payer: Self-pay | Admitting: Family Medicine

## 2021-02-12 ENCOUNTER — Other Ambulatory Visit: Payer: Self-pay

## 2021-02-12 ENCOUNTER — Ambulatory Visit (INDEPENDENT_AMBULATORY_CARE_PROVIDER_SITE_OTHER): Payer: Medicaid Other | Admitting: Family Medicine

## 2021-02-12 VITALS — BP 103/58 | HR 107 | Wt 130.0 lb

## 2021-02-12 DIAGNOSIS — O98512 Other viral diseases complicating pregnancy, second trimester: Secondary | ICD-10-CM

## 2021-02-12 DIAGNOSIS — U071 COVID-19: Secondary | ICD-10-CM

## 2021-02-12 DIAGNOSIS — O26899 Other specified pregnancy related conditions, unspecified trimester: Secondary | ICD-10-CM

## 2021-02-12 DIAGNOSIS — Z23 Encounter for immunization: Secondary | ICD-10-CM

## 2021-02-12 DIAGNOSIS — R0602 Shortness of breath: Secondary | ICD-10-CM

## 2021-02-12 DIAGNOSIS — Z3A26 26 weeks gestation of pregnancy: Secondary | ICD-10-CM

## 2021-02-12 DIAGNOSIS — Z3492 Encounter for supervision of normal pregnancy, unspecified, second trimester: Secondary | ICD-10-CM

## 2021-02-12 NOTE — Progress Notes (Addendum)
Subjective:  Michelle Stewart is a 20 y.o. G1P0000 at [redacted]w[redacted]d being seen today for ongoing prenatal care.  She is currently monitored for the following issues for this low-risk pregnancy and has Adjustment disorder with mixed disturbance of emotions and conduct; Supervision of low-risk pregnancy; and UTI (urinary tract infection) during pregnancy on their problem list.  Patient reports fatigue and progressive shortness of breath . She noticed SOB slowly starting in her first trimester and feels like it has gotten worse over the past few weeks gradually. Worse with activity/movements. Recently had COVID on 8/2, feels overall improved from this with infrequent cough. Denies any associated chest pain, swelling, frequent cough, fever, wheezing, pre-/syncopal episodes, or palpitations. She felt less fetal movement through yesterday, but after doppler during check in has felt frequent movement like her normal. She has not done kick counts before. She is overwhelmed because of first pregnancy and unsure what is normal. Contractions: Not present. Vag. Bleeding: None.  Movement: Present. Denies leaking of fluid.   The following portions of the patient's history were reviewed and updated as appropriate: allergies, current medications, past family history, past medical history, past social history, past surgical history and problem list.   Objective:   Vitals:   02/12/21 0915  BP: (!) 103/58  Pulse: (!) 107  Weight: 130 lb (59 kg)  HR on recheck: 96 bpm  Pulse ox: 100%   Fetal Status: Fetal Heart Rate (bpm): 127 Fundal Height: 26 cm Movement: Present    General:  Alert, oriented and cooperative. Patient is in no acute distress.  Skin: Skin is warm and dry. No rash noted.   Cardiovascular: Regular rate and rhythm, no murmurs appreciated   Respiratory: CTA bilaterally a/p. No wheezing or decreased air movement heard. Normal respiratory effort and able to speak in full sentences without difficulty.    Abdomen: Soft, gravid, appropriate for gestational age. Pain/Pressure: Present can feel active fetal movement with palpation     Pelvic:  Cervical exam deferred        Extremities: Normal range of motion.  Edema: Trace  Mental Status: Normal mood and affect. Normal behavior. Normal judgment and thought content.    Assessment and Plan:  Pregnancy: G1P0000 at [redacted]w[redacted]d  1. Encounter for supervision of low-risk pregnancy in second trimester Not fasting, will return for GTT and 28 week labs. - Tdap vaccine greater than or equal to 7yo IM - Flu Vaccine QUAD 84mo+IM (Fluarix, Fluzone & Alfiuria Quad PF)  2. Shortness of breath due to pregnancy History and exam most consistent with physiologic changes and may be with residual COVID (+ on 8/2), provided reassurance and expectations. No additional s/sx consistent with PNA, PE, or arrhthymia as etiology, however discussed alarm s/sx to be seen for if this progresses. Encouraged breaks as needed, adequate hydration, and compression stockings.   3. COVID-19 affecting pregnancy in second trimester 8/2, did not require hospitalization. Recommended considering COVID vaccine in the next month as her natural immunity will wean.   4. [redacted] weeks gestation of pregnancy Initially endorsed decreased movement that quickly resolved and maintained normal movement throughout appointment. + FHTs. Discussed kick counts and sent MyChart message with further information. If recurrent and persistent despite counts, should be seen in the MAU right away for further evaluation. Discussed this with Dr. Donavan Foil shortly after patient checked out, did not feel she needed to return for an NST especially as movement returned to normal.   Preterm labor symptoms and general obstetric precautions including but  not limited to vaginal bleeding, contractions, leaking of fluid and fetal movement were reviewed in detail with the patient. Please refer to After Visit Summary for other counseling  recommendations.   Return in about 3 weeks (around 03/05/2021) for LROB.   Allayne Stack, DO

## 2021-02-12 NOTE — Progress Notes (Signed)
Patient reports not feeling the baby move since yesterday- states she normally feels the baby move all the time

## 2021-02-18 ENCOUNTER — Other Ambulatory Visit: Payer: Medicaid Other

## 2021-02-18 ENCOUNTER — Other Ambulatory Visit: Payer: Self-pay

## 2021-02-18 ENCOUNTER — Encounter: Payer: Self-pay | Admitting: Family Medicine

## 2021-02-18 ENCOUNTER — Other Ambulatory Visit: Payer: Self-pay | Admitting: General Practice

## 2021-02-18 DIAGNOSIS — Z3490 Encounter for supervision of normal pregnancy, unspecified, unspecified trimester: Secondary | ICD-10-CM | POA: Diagnosis not present

## 2021-02-18 DIAGNOSIS — Z3493 Encounter for supervision of normal pregnancy, unspecified, third trimester: Secondary | ICD-10-CM

## 2021-02-19 LAB — CBC
Hematocrit: 33.3 % — ABNORMAL LOW (ref 34.0–46.6)
Hemoglobin: 11.5 g/dL (ref 11.1–15.9)
MCH: 32.1 pg (ref 26.6–33.0)
MCHC: 34.5 g/dL (ref 31.5–35.7)
MCV: 93 fL (ref 79–97)
Platelets: 211 10*3/uL (ref 150–450)
RBC: 3.58 x10E6/uL — ABNORMAL LOW (ref 3.77–5.28)
RDW: 11.3 % — ABNORMAL LOW (ref 11.7–15.4)
WBC: 10.7 10*3/uL (ref 3.4–10.8)

## 2021-02-19 LAB — HIV ANTIBODY (ROUTINE TESTING W REFLEX): HIV Screen 4th Generation wRfx: NONREACTIVE

## 2021-02-19 LAB — GLUCOSE TOLERANCE, 2 HOURS W/ 1HR
Glucose, 1 hour: 61 mg/dL — ABNORMAL LOW (ref 65–179)
Glucose, 2 hour: 66 mg/dL (ref 65–152)
Glucose, Fasting: 63 mg/dL — ABNORMAL LOW (ref 65–91)

## 2021-02-19 LAB — RPR: RPR Ser Ql: NONREACTIVE

## 2021-02-20 ENCOUNTER — Other Ambulatory Visit: Payer: Self-pay

## 2021-02-20 ENCOUNTER — Ambulatory Visit
Admission: EM | Admit: 2021-02-20 | Discharge: 2021-02-20 | Discharge status: Against Medical Advice | Payer: Medicaid Other

## 2021-02-20 ENCOUNTER — Encounter (HOSPITAL_COMMUNITY): Payer: Self-pay | Admitting: Obstetrics and Gynecology

## 2021-02-20 ENCOUNTER — Inpatient Hospital Stay (HOSPITAL_COMMUNITY)
Admission: AD | Admit: 2021-02-20 | Discharge: 2021-02-20 | Disposition: A | Discharge status: Home / Self Care | Payer: Medicaid Other | Attending: Obstetrics and Gynecology | Admitting: Obstetrics and Gynecology

## 2021-02-20 DIAGNOSIS — Z3A27 27 weeks gestation of pregnancy: Secondary | ICD-10-CM | POA: Diagnosis not present

## 2021-02-20 DIAGNOSIS — O99282 Endocrine, nutritional and metabolic diseases complicating pregnancy, second trimester: Secondary | ICD-10-CM

## 2021-02-20 DIAGNOSIS — E162 Hypoglycemia, unspecified: Secondary | ICD-10-CM

## 2021-02-20 LAB — URINALYSIS, ROUTINE W REFLEX MICROSCOPIC
Bilirubin Urine: NEGATIVE
Glucose, UA: NEGATIVE mg/dL
Hgb urine dipstick: NEGATIVE
Ketones, ur: NEGATIVE mg/dL
Nitrite: NEGATIVE
Protein, ur: NEGATIVE mg/dL
Specific Gravity, Urine: 1.015 (ref 1.005–1.030)
pH: 8 (ref 5.0–8.0)

## 2021-02-20 LAB — GLUCOSE, CAPILLARY
Glucose-Capillary: 58 mg/dL — ABNORMAL LOW (ref 70–99)
Glucose-Capillary: 64 mg/dL — ABNORMAL LOW (ref 70–99)

## 2021-02-20 LAB — URINALYSIS, MICROSCOPIC (REFLEX)

## 2021-02-20 NOTE — MAU Note (Signed)
Pt reports about an hour ago she started feeling dizzy, felt like her heart was racing, and she was going to pass out. Was at work and she stopped and ate something and rested for a minute and still felt that way so they checked her b/p and told her it was elevated (doesn't know the number). Because she felt SOB they told her to come to the hospital. Denies bleeding or abd pain.

## 2021-02-20 NOTE — MAU Provider Note (Addendum)
History     CSN: [id number]  Arrival date and time: 02/20/21 1339   Event Date/Time   First Provider Initiated Contact with Patient 02/20/21 1419      Chief Complaint  Patient presents with   Shortness of Breath   Dizziness   HPI Michelle Stewart is a 20 y.o. G1P0000 at [redacted]w[redacted]d who presents from work with complaints of dizziness, elevated HR and elevated blood pressure. She states she was at work and felt hot and dizzy and like her heart was racing. She reports she had a coworker check her BP and it was high but does not know what it was. She denies any bleeding, leaking or discharge. Denies abdominal pain and reports normal fetal movement. She reports she ate a muffin today total.   OB History     Gravida  1   Para  0   Term  0   Preterm  0   AB  0   Living  0      SAB  0   IAB  0   Ectopic  0   Multiple  0   Live Births  0           History reviewed. No pertinent past medical history.  Past Surgical History:  Procedure Laterality Date   MANDIBLE SURGERY      Family History  Problem Relation Age of Onset   Healthy Mother    Healthy Father     Social History   Tobacco Use   Smoking status: Never   Smokeless tobacco: Never  Vaping Use   Vaping Use: Never used  Substance Use Topics   Alcohol use: Never   Drug use: Never    Allergies:  Allergies  Allergen Reactions   Albolene Swelling and Rash   Lactose Diarrhea and Other (See Comments)    constipation     Medications Prior to Admission  Medication Sig Dispense Refill Last Dose   Prenatal Vit-Fe Fumarate-FA (PREPLUS) 27-1 MG TABS Take 1 tablet by mouth daily. 30 tablet 0 02/20/2021    Review of Systems  Constitutional: Negative.  Negative for fatigue and fever.  HENT: Negative.    Respiratory: Negative.  Negative for shortness of breath.   Cardiovascular: Negative.  Negative for chest pain.  Gastrointestinal: Negative.  Negative for abdominal pain, constipation, diarrhea, nausea and  vomiting.  Genitourinary: Negative.  Negative for dysuria, vaginal bleeding and vaginal discharge.  Neurological:  Positive for dizziness. Negative for headaches.  Physical Exam   Blood pressure 119/68, pulse (!) 103, temperature 98.6 F (37 C), temperature source Oral, resp. rate 18, height 5' (1.524 m), weight 58.4 kg, last menstrual period 08/02/2020, SpO2 100 %.  Physical Exam Vitals and nursing note reviewed.  Constitutional:      General: She is not in acute distress.    Appearance: She is well-developed.  HENT:     Head: Normocephalic.  Eyes:     Pupils: Pupils are equal, round, and reactive to light.  Cardiovascular:     Rate and Rhythm: Normal rate and regular rhythm.     Heart sounds: Normal heart sounds.  Pulmonary:     Effort: Pulmonary effort is normal. No respiratory distress.     Breath sounds: Normal breath sounds.  Abdominal:     General: Bowel sounds are normal. There is no distension.     Palpations: Abdomen is soft.     Tenderness: There is no abdominal tenderness.  Skin:    General: Skin  is warm and dry.  Neurological:     Mental Status: She is alert and oriented to person, place, and time.  Psychiatric:        Mood and Affect: Mood normal.        Behavior: Behavior normal.        Thought Content: Thought content normal.        Judgment: Judgment normal.   Fetal Tracing:  Baseline: 125 Variability: moderate Accels: 15x15 Decels: none  Toco: none  MAU Course  Procedures Results for orders placed or performed during the hospital encounter of 02/20/21 (from the past 24 hour(s))  Urinalysis, Routine w reflex microscopic Urine, Clean Catch     Status: Abnormal   Collection Time: 02/20/21  2:16 PM  Result Value Ref Range   Color, Urine AMBER (A) YELLOW   APPearance CLEAR CLEAR   Specific Gravity, Urine 1.015 1.005 - 1.030   pH 8.0 5.0 - 8.0   Glucose, UA NEGATIVE NEGATIVE mg/dL   Hgb urine dipstick NEGATIVE NEGATIVE   Bilirubin Urine NEGATIVE  NEGATIVE   Ketones, ur NEGATIVE NEGATIVE mg/dL   Protein, ur NEGATIVE NEGATIVE mg/dL   Nitrite NEGATIVE NEGATIVE   Leukocytes,Ua SMALL (A) NEGATIVE  Urinalysis, Microscopic (reflex)     Status: Abnormal   Collection Time: 02/20/21  2:16 PM  Result Value Ref Range   RBC / HPF 0-5 0 - 5 RBC/hpf   WBC, UA 0-5 0 - 5 WBC/hpf   Bacteria, UA RARE (A) NONE SEEN   Squamous Epithelial / LPF 0-5 0 - 5   Mucus PRESENT   Glucose, capillary     Status: Abnormal   Collection Time: 02/20/21  2:21 PM  Result Value Ref Range   Glucose-Capillary 58 (L) 70 - 99 mg/dL  Glucose, capillary     Status: Abnormal   Collection Time: 02/20/21  3:51 PM  Result Value Ref Range   Glucose-Capillary 64 (L) 70 - 99 mg/dL    MDM UA CBG- 58, juice and crackers given, repeat improved.   Normotensive and normal HR in MAU  Long discussion with patient regarding proper nutrition in pregnancy. Patient reports she hasn't been eating much in an attempt to not gain weight. Discussed importance of eating high protein meals and snacks frequently throughout the day.   Assessment and Plan   1. Hypoglycemia   2. [redacted] weeks gestation of pregnancy    -Discharge home in stable condition -Third trimester precautions discussed -Patient advised to follow-up with OB as scheduled for prenatal care -Patient may return to MAU as needed or if her condition were to change or worsen   Rolm Bookbinder CNM 02/20/2021, 2:19 PM

## 2021-02-20 NOTE — MAU Note (Signed)
CBG 58, provider at bedside. Skin w/d. Given sweet tea, crackers and peanut butter

## 2021-02-20 NOTE — Discharge Instructions (Signed)

## 2021-02-23 ENCOUNTER — Ambulatory Visit (INDEPENDENT_AMBULATORY_CARE_PROVIDER_SITE_OTHER): Payer: Medicaid Other

## 2021-02-23 ENCOUNTER — Other Ambulatory Visit: Payer: Self-pay

## 2021-02-23 DIAGNOSIS — N898 Other specified noninflammatory disorders of vagina: Secondary | ICD-10-CM

## 2021-02-23 DIAGNOSIS — O26893 Other specified pregnancy related conditions, third trimester: Secondary | ICD-10-CM

## 2021-02-23 LAB — AB SCR+ANTIBODY ID

## 2021-02-23 LAB — ANTIBODY SCREEN

## 2021-02-23 NOTE — Progress Notes (Addendum)
Patient is here with concern of vaginal discharges.  Patient denied any leakage, and declined vaginal swab.  Patient encouraged to wear a pad to monitor her discharge as she felt that she was leaking urine at time as well.  Pt advised that if she is leaking watery fluid that drips like a faucet and does not stop to please contact the office.  Patient will continue to monitor and will follow up at her next OB visit on 03/05/2021.   Chart reviewed for nurse visit. Agree with plan of care.   Currie Paris, NP 02/23/2021 1:07 PM

## 2021-03-02 ENCOUNTER — Telehealth: Payer: Self-pay | Admitting: Family Medicine

## 2021-03-02 ENCOUNTER — Encounter: Payer: Self-pay | Admitting: Family Medicine

## 2021-03-02 NOTE — Telephone Encounter (Signed)
Patient called requesting something for her to check her Blood Sugar everyday

## 2021-03-02 NOTE — Telephone Encounter (Signed)
Returned patients call and she reports she was feeling dizzy and light headed. She reports she purchased one and her blood sugars are 60-63.   She ate at 8 am and took blood sugar at 1 and it was 60.   She reports she is eating:   Breakfast 8 am  Chicken, sausage, grilled chicken biscuit, OJ, fruit  Snack 11 am   Lemon pound cake, juice, water  Lunch  3 pm grilled nuggets only.   She has a migraine like headache today. She has some swelling in her feet and some SOB, this is not new and has been evaluated. Patient has been taking her BP at home, last one was 131/90.   She is drinking well.   She usually eats a meat for dinner, like a grilled chicken sandwich and fruit cup.   She does not like meat. Patient is Lactose intolerant. Advised trying to eat nuts, cheese, beans, peanut butter, Lactose free protein shakes.   Infant to follow up in the office on Thursday.

## 2021-03-05 ENCOUNTER — Other Ambulatory Visit: Payer: Self-pay

## 2021-03-05 ENCOUNTER — Ambulatory Visit (INDEPENDENT_AMBULATORY_CARE_PROVIDER_SITE_OTHER): Payer: Medicaid Other | Admitting: Obstetrics and Gynecology

## 2021-03-05 ENCOUNTER — Ambulatory Visit
Admission: EM | Admit: 2021-03-05 | Discharge: 2021-03-05 | Disposition: A | Discharge status: Home / Self Care | Payer: Medicaid Other | Attending: Urgent Care | Admitting: Urgent Care

## 2021-03-05 ENCOUNTER — Encounter: Payer: Self-pay | Admitting: Obstetrics and Gynecology

## 2021-03-05 ENCOUNTER — Encounter: Payer: Self-pay | Admitting: Emergency Medicine

## 2021-03-05 VITALS — BP 104/66 | HR 88 | Wt 127.5 lb

## 2021-03-05 DIAGNOSIS — E162 Hypoglycemia, unspecified: Secondary | ICD-10-CM

## 2021-03-05 DIAGNOSIS — Z3493 Encounter for supervision of normal pregnancy, unspecified, third trimester: Secondary | ICD-10-CM

## 2021-03-05 DIAGNOSIS — Z3A29 29 weeks gestation of pregnancy: Secondary | ICD-10-CM

## 2021-03-05 DIAGNOSIS — R42 Dizziness and giddiness: Secondary | ICD-10-CM

## 2021-03-05 LAB — POCT FASTING CBG KUC MANUAL ENTRY: POCT Glucose (KUC): 86 mg/dL (ref 70–99)

## 2021-03-05 NOTE — Progress Notes (Signed)
Patient is here for routine prenatal visit. She denies any pain or vaginal bleeding. However, she is concern about her glucose readings. She stated that she checks her "sugars" at home and they always range in the mid or low 60's

## 2021-03-05 NOTE — ED Triage Notes (Signed)
Patient states that she was at work today and she felt like she was going to pass out.  Patient is [redacted] weeks pregnant.  Patient did pass her glucose test with her OGYN.  Patient has eaten today...grilled chicken biscuit and fruit cup for breakfast.  Grilled nuggets for lunch and drinking water.

## 2021-03-05 NOTE — Progress Notes (Signed)
Subjective:  Michelle Stewart is a 20 y.o. G1P0000 at [redacted]w[redacted]d being seen today for ongoing prenatal care.  She is currently monitored for the following issues for this low-risk pregnancy and has Adjustment disorder with mixed disturbance of emotions and conduct; Supervision of low-risk pregnancy; and UTI (urinary tract infection) during pregnancy on their problem list.  Patient reports no complaints.  Contractions: Not present. Vag. Bleeding: None.  Movement: Present. Denies leaking of fluid.   The following portions of the patient's history were reviewed and updated as appropriate: allergies, current medications, past family history, past medical history, past social history, past surgical history and problem list. Problem list updated.  Objective:   Vitals:   03/05/21 0820  BP: 104/66  Pulse: 88  Weight: 127 lb 8 oz (57.8 kg)    Fetal Status: Fetal Heart Rate (bpm): 134   Movement: Present     General:  Alert, oriented and cooperative. Patient is in no acute distress.  Skin: Skin is warm and dry. No rash noted.   Cardiovascular: Normal heart rate noted  Respiratory: Normal respiratory effort, no problems with respiration noted  Abdomen: Soft, gravid, appropriate for gestational age. Pain/Pressure: Absent     Pelvic:  Cervical exam deferred        Extremities: Normal range of motion.  Edema: Trace  Mental Status: Normal mood and affect. Normal behavior. Normal judgment and thought content.   Urinalysis:      Assessment and Plan:  Pregnancy: G1P0000 at [redacted]w[redacted]d  1. Encounter for supervision of low-risk pregnancy in third trimester Stable Glucola today  Preterm labor symptoms and general obstetric precautions including but not limited to vaginal bleeding, contractions, leaking of fluid and fetal movement were reviewed in detail with the patient. Please refer to After Visit Summary for other counseling recommendations.  Return in about 2 weeks (around 03/19/2021) for OB visit, face to  face, any provider.   Hermina Staggers, MD

## 2021-03-05 NOTE — ED Provider Notes (Signed)
[facility]   MRN: [id number] DOB: 2001-05-10  Subjective:   Michelle Stewart is a 20 y.o. female that is [redacted] weeks pregnant presenting for intermittent episodes of feeling faint.  Patient has been having a difficult time with hypoglycemia.  Denies any confusion, vision changes, chest pain, heart racing, diaphoresis, nausea, vomiting, abdominal pain, vaginal bleeding.  She is been working on this with her obstetrician.  Has an appointment in 1 week.  No current facility-administered medications for this encounter.  Current Outpatient Medications:  .  Prenatal Vit-Fe Fumarate-FA (PREPLUS) 27-1 MG TABS, Take 1 tablet by mouth daily., Disp: 30 tablet, Rfl: 0    Allergies  Allergen Reactions  . Albolene Swelling and Rash  . Lactose Diarrhea and Other (See Comments)    constipation     No past medical history on file.    Past Surgical History:  Procedure Laterality Date  . MANDIBLE SURGERY      Family History  Problem Relation Age of Onset  . Healthy Mother   . Healthy Father     Social History   Tobacco Use  . Smoking status: Never  . Smokeless tobacco: Never  Vaping Use  . Vaping Use: Never used  Substance Use Topics  . Alcohol use: Never  . Drug use: Never    ROS   Objective:   Vitals: BP (!) 104/56 (BP Location: Left Arm)   Pulse (!) 108   Temp 98.2 F (36.8 C) (Oral)   Ht 5\' 1"  (1.549 m)   Wt 127 lb (57.6 kg)   LMP 08/02/2020   SpO2 98%   BMI 24.00 kg/m   Pulse recheck was 96 bpm.  Physical Exam Constitutional:      General: She is not in acute distress.    Appearance: Normal appearance. She is well-developed. She is not ill-appearing, toxic-appearing or diaphoretic.  HENT:     Head: Normocephalic and atraumatic.     Nose: Nose normal.     Mouth/Throat:     Mouth: Mucous membranes are moist.  Eyes:     Extraocular Movements: Extraocular movements intact.     Pupils: Pupils are equal, round, and reactive to light.   Cardiovascular:     Rate and Rhythm: Normal rate and regular rhythm.     Pulses: Normal pulses.     Heart sounds: Normal heart sounds. No murmur heard.   No friction rub. No gallop.  Pulmonary:     Effort: Pulmonary effort is normal. No respiratory distress.     Breath sounds: Normal breath sounds. No stridor. No wheezing, rhonchi or rales.  Skin:    General: Skin is warm and dry.     Findings: No rash.  Neurological:     Mental Status: She is alert and oriented to person, place, and time.     Cranial Nerves: No cranial nerve deficit.     Motor: No weakness.     Coordination: Coordination normal.     Gait: Gait normal.     Deep Tendon Reflexes: Reflexes normal.  Psychiatric:        Mood and Affect: Mood normal.        Behavior: Behavior normal.        Thought Content: Thought content normal.        Judgment: Judgment normal.   Results for orders placed or performed during the hospital encounter of 03/05/21 (from the past 24 hour(s))  POCT CBG (manual entry)     Status: None   Collection  Time: 03/05/21  2:40 PM  Result Value Ref Range   POCT Glucose ([facility]) 86 70 - 99 mg/dL   Assessment and Plan :   PDMP not reviewed this encounter.  1. Hypoglycemia   2. Feeling faint   3. [redacted] weeks gestation of pregnancy     Recommended patient larger meals 4 times daily with snacks in between.  At this time I do not see signs of an acute emergency warranting a visit to the maternal admission unit.  Encourage patient to continue hydrating well.  She does have an appointment with her OB coming up soon.  Maintain strict ER/MAU precautions.  Provide her with a note for work.  Maintain current medication regimen, prenatal vitamin. Counseled patient on potential for adverse effects with medications prescribed/recommended today, ER and return-to-clinic precautions discussed, patient verbalized understanding.    Wallis Bamberg, [place] 03/05/21 1457

## 2021-03-05 NOTE — Patient Instructions (Signed)

## 2021-03-18 ENCOUNTER — Encounter (HOSPITAL_COMMUNITY): Payer: Self-pay | Admitting: Obstetrics and Gynecology

## 2021-03-18 ENCOUNTER — Inpatient Hospital Stay (HOSPITAL_COMMUNITY)
Admission: AD | Admit: 2021-03-18 | Discharge: 2021-03-18 | Disposition: A | Discharge status: Home / Self Care | Payer: Medicaid Other | Attending: Obstetrics and Gynecology | Admitting: Obstetrics and Gynecology

## 2021-03-18 ENCOUNTER — Other Ambulatory Visit: Payer: Self-pay

## 2021-03-18 DIAGNOSIS — O26893 Other specified pregnancy related conditions, third trimester: Secondary | ICD-10-CM | POA: Diagnosis present

## 2021-03-18 DIAGNOSIS — Z3689 Encounter for other specified antenatal screening: Secondary | ICD-10-CM | POA: Diagnosis not present

## 2021-03-18 DIAGNOSIS — Z3A31 31 weeks gestation of pregnancy: Secondary | ICD-10-CM | POA: Diagnosis not present

## 2021-03-18 DIAGNOSIS — O98813 Other maternal infectious and parasitic diseases complicating pregnancy, third trimester: Secondary | ICD-10-CM | POA: Insufficient documentation

## 2021-03-18 DIAGNOSIS — B3731 Acute candidiasis of vulva and vagina: Secondary | ICD-10-CM | POA: Diagnosis not present

## 2021-03-18 LAB — URINALYSIS, ROUTINE W REFLEX MICROSCOPIC
Bilirubin Urine: NEGATIVE
Glucose, UA: NEGATIVE mg/dL
Hgb urine dipstick: NEGATIVE
Ketones, ur: 5 mg/dL — AB
Nitrite: NEGATIVE
Protein, ur: 30 mg/dL — AB
Specific Gravity, Urine: 1.027 (ref 1.005–1.030)
WBC, UA: >50 WBC/hpf — ABNORMAL HIGH (ref 0–5)
pH: 6 (ref 5.0–8.0)

## 2021-03-18 LAB — WET PREP, GENITAL
Clue Cells Wet Prep HPF POC: NONE SEEN
Sperm: NONE SEEN
Trich, Wet Prep: NONE SEEN

## 2021-03-18 LAB — POCT FERN TEST: POCT Fern Test: NEGATIVE

## 2021-03-18 MED ORDER — TERCONAZOLE 0.4 % VA CREA: 1.0000 | TOPICAL_CREAM | Freq: Every day | VAGINAL | 0 refills | Status: DC

## 2021-03-18 NOTE — MAU Provider Note (Signed)
History   [id number]   Chief Complaint  Patient presents with   Rupture of Membranes    HPI Michelle Stewart is a 20 y.o. female  G1P0000 @31 .3 wks here with report of leaking clear fluid since yesterday.  Leaking of fluid has continued. Pt denies contractions. She denies vaginal bleeding. Last intercourse was last night after the initial leaking. She reports good fetal movement. She denies vaginal itching or malodor. All other systems negative.    Patient's last menstrual period was 08/02/2020.  OB History  Gravida Para Term Preterm AB Living  1 0 0 0 0 0  SAB IAB Ectopic Multiple Live Births  0 0 0 0 0    # Outcome Date GA Lbr Len/2nd Weight Sex Delivery Anes PTL Lv  1 Current             History reviewed. No pertinent past medical history.  Family History  Problem Relation Age of Onset   Healthy Mother    Healthy Father     Social History   Socioeconomic History   Marital status: Single    Spouse name: Not on file   Number of children: Not on file   Years of education: Not on file   Highest education level: Not on file  Occupational History   Not on file  Tobacco Use   Smoking status: Never   Smokeless tobacco: Never  Vaping Use   Vaping Use: Never used  Substance and Sexual Activity   Alcohol use: Never   Drug use: Never   Sexual activity: Yes    Birth control/protection: None  Other Topics Concern   Not on file  Social History Narrative   Not on file   Social Determinants of Health   Financial Resource Strain: Not on file  Food Insecurity: No Food Insecurity   Worried About Running Out of Food in the Last Year: Never true   Ran Out of Food in the Last Year: Never true  Transportation Needs: No Transportation Needs   Lack of Transportation (Medical): No   Lack of Transportation (Non-Medical): No  Physical Activity: Not on file  Stress: Not on file  Social Connections: Not on file    Allergies  Allergen Reactions   Albolene Swelling and  Rash   Lactose Diarrhea and Other (See Comments)    constipation     No current facility-administered medications on file prior to encounter.   Current Outpatient Medications on File Prior to Encounter  Medication Sig Dispense Refill   Prenatal Vit-Fe Fumarate-FA (PREPLUS) 27-1 MG TABS Take 1 tablet by mouth daily. 30 tablet 0     Review of Systems  Gastrointestinal:  Negative for abdominal pain.  Genitourinary:  Positive for vaginal discharge. Negative for vaginal bleeding.    Physical Exam   Vitals:   03/18/21 0928  BP: 108/66  Pulse: (!) 105  Resp: 15  Temp: 98.2 F (36.8 C)  SpO2: 100%  Weight: 60.3 kg  Height: 5\' 1"  (1.549 m)    Physical Exam Vitals and nursing note reviewed. Exam conducted with a chaperone present.  Constitutional:      General: She is not in acute distress.    Appearance: Normal appearance.  HENT:     Head: Normocephalic and atraumatic.  Pulmonary:     Effort: Pulmonary effort is normal. No respiratory distress.  Abdominal:     Palpations: Abdomen is soft.     Tenderness: There is no abdominal tenderness.  Genitourinary:  Comments: SSE: no pool, fern neg; copious amt yellow/green adherent curdy discharge SVE: closed/thick Musculoskeletal:        General: Normal range of motion.     Cervical back: Normal range of motion.  Skin:    General: Skin is warm and dry.  Neurological:     General: No focal deficit present.     Mental Status: She is alert and oriented to person, place, and time.  Psychiatric:        Mood and Affect: Mood normal.        Behavior: Behavior normal.  EFM: 130 bpm, mod variability, + accels, no decels Toco: UI  Results for orders placed or performed during the hospital encounter of 03/18/21 (from the past 24 hour(s))  Urinalysis, Routine w reflex microscopic Urine, Clean Catch     Status: Abnormal   Collection Time: 03/18/21  9:54 AM  Result Value Ref Range   Color, Urine YELLOW YELLOW   APPearance CLOUDY  (A) CLEAR   Specific Gravity, Urine 1.027 1.005 - 1.030   pH 6.0 5.0 - 8.0   Glucose, UA NEGATIVE NEGATIVE mg/dL   Hgb urine dipstick NEGATIVE NEGATIVE   Bilirubin Urine NEGATIVE NEGATIVE   Ketones, ur 5 (A) NEGATIVE mg/dL   Protein, ur 30 (A) NEGATIVE mg/dL   Nitrite NEGATIVE NEGATIVE   Leukocytes,Ua LARGE (A) NEGATIVE   RBC / HPF 6-10 0 - 5 RBC/hpf   WBC, UA >50 (H) 0 - 5 WBC/hpf   Bacteria, UA MANY (A) NONE SEEN   Squamous Epithelial / LPF 6-10 0 - 5   Mucus PRESENT   Wet prep, genital     Status: Abnormal   Collection Time: 03/18/21 10:10 AM   Specimen: Vaginal  Result Value Ref Range   Yeast Wet Prep HPF POC PRESENT (A) NONE SEEN   Trich, Wet Prep NONE SEEN NONE SEEN   Clue Cells Wet Prep HPF POC NONE SEEN NONE SEEN   WBC, Wet Prep HPF POC MANY (A) NONE SEEN   Sperm NONE SEEN   Fern Test     Status: None   Collection Time: 03/18/21 10:33 AM  Result Value Ref Range   POCT Fern Test Negative = intact amniotic membranes    MAU Course  Procedures  MDM Labs ordered and reviewed. No signs of PROM or PTL. Will treat yeast. UA with leuks and bacteria, UC sent. Stable for discharge home.   Assessment and Plan   1. [redacted] weeks gestation of pregnancy   2. NST (non-stress test) reactive   3. Yeast vaginitis    Discharge home Follow up at [facility] as scheduled Rx Terazol  Allergies as of 03/18/2021       Reactions   Albolene Swelling, Rash   Lactose Diarrhea, Other (See Comments)   constipation        Medication List     TAKE these medications    PrePLUS 27-1 MG Tabs Take 1 tablet by mouth daily.   terconazole 0.4 % vaginal cream Commonly known as: TERAZOL 7 Place 1 applicator vaginally at bedtime.        Donette Larry, CNM 03/18/2021 10:34 AM

## 2021-03-18 NOTE — MAU Note (Signed)
Pt reports leaking clear fluid since 0900 yesterday. Has been happening off/on since then. Having occasional uc since last pm. Reports good fetal movement. Everything WNL last week at office.

## 2021-03-19 LAB — GC/CHLAMYDIA PROBE AMP (~~LOC~~) NOT AT ARMC
Chlamydia: NEGATIVE
Comment: NEGATIVE
Comment: NORMAL
Neisseria Gonorrhea: NEGATIVE

## 2021-03-19 LAB — CULTURE, OB URINE

## 2021-03-23 ENCOUNTER — Ambulatory Visit: Payer: Self-pay

## 2021-03-23 NOTE — Telephone Encounter (Signed)
Pt is 32 weeks and is spotting.  Good fetal movement, no ctns.  Advised pt to go to L&D to be checked out.  Pt agrees with plan.   Reason for Disposition  [1] Pregnant 24-36 weeks (preterm) AND [2] pinkish or brownish mucous discharge  Answer Assessment - Initial Assessment Questions 1. ONSET: "When did this bleeding start?"        today 2. DESCRIPTION: "Describe the bleeding that you are having." "How much bleeding is there?"    - SPOTTING: spotting, or pinkish / brownish mucous discharge; does not fill panty liner or pad    - MILD:  less than 1 pad / hour; less than patient's usual menstrual bleeding   - MODERATE: 1-2 pads / hour; 1 menstrual cup every 6 hours; small-medium blood clots (e.g., pea, grape, small coin)   - SEVERE: soaking 2 or more pads/hour for 2 or more hours; 1 menstrual cup every 2 hours; bleeding not contained by pads or continuous red blood from vagina; large blood clots (e.g., golf ball, large coin)      spotting 3. ABDOMINAL PAIN SEVERITY: If present, ask: "How bad is it?"  (e.g., Scale 1-10; mild, moderate, or severe)   - MILD (1-3): doesn't interfere with normal activities, abdomen soft and not tender to touch    - MODERATE (4-7): interferes with normal activities or awakens from sleep, abdomen tender to touch    - SEVERE (8-10): excruciating pain, doubled over, unable to do any normal activities     0 4. PREGNANCY: "Do you know how many weeks or months pregnant you are?"      32 5. EDD: "What date are you expecting to deliver?"     na 6. FETAL MOVEMENT: "Has the baby's movement decreased or changed significantly from normal?"     no 7. HEMODYNAMIC STATUS: "Are you weak or feeling lightheaded?" If Yes, ask: "Can you stand and walk normally?"      no 8. OTHER SYMPTOMS: "What other symptoms are you having with the bleeding?" (e.g., leaking fluid from vagina, contractions)     na  Protocols used: Pregnancy - Vaginal Bleeding Greater Than [redacted] Weeks  EGA-A-AH

## 2021-03-24 ENCOUNTER — Ambulatory Visit (INDEPENDENT_AMBULATORY_CARE_PROVIDER_SITE_OTHER): Payer: Medicaid Other | Admitting: Family Medicine

## 2021-03-24 ENCOUNTER — Other Ambulatory Visit: Payer: Self-pay

## 2021-03-24 VITALS — BP 105/80 | HR 108 | Wt 132.1 lb

## 2021-03-24 DIAGNOSIS — Z3493 Encounter for supervision of normal pregnancy, unspecified, third trimester: Secondary | ICD-10-CM

## 2021-03-24 DIAGNOSIS — Z3A32 32 weeks gestation of pregnancy: Secondary | ICD-10-CM

## 2021-03-24 DIAGNOSIS — N939 Abnormal uterine and vaginal bleeding, unspecified: Secondary | ICD-10-CM

## 2021-03-24 MED ORDER — FLUCONAZOLE 150 MG PO TABS: 150.0000 mg | ORAL_TABLET | Freq: Every day | ORAL | 0 refills | Status: DC

## 2021-03-24 NOTE — Progress Notes (Signed)
Patient stated that she had "vaginal spotting" yesterday, denies pain

## 2021-03-24 NOTE — Progress Notes (Signed)
    Subjective:  Michelle Stewart is a 20 y.o. G1P0000 at [redacted]w[redacted]d being seen today for ongoing prenatal care.  She is currently monitored for the following issues for this low-risk pregnancy and has Adjustment disorder with mixed disturbance of emotions and conduct; Supervision of low-risk pregnancy; and UTI (urinary tract infection) during pregnancy on their problem list.  Patient reports  spotting .  Contractions: Not present. Vag. Bleeding: Scant.  Movement: Present. Denies leaking of fluid.   #Spotting - started yesterday - mostly when wipe - no pain with peeing - currently wearing pad that has no blood - reports yesterday had dark spots of discharge  The following portions of the patient's history were reviewed and updated as appropriate: allergies, current medications, past family history, past medical history, past social history, past surgical history and problem list. Problem list updated.  Objective:   Vitals:   03/24/21 1008  BP: 105/80  Pulse: (!) 108  Weight: 132 lb 1.6 oz (59.9 kg)    Fetal Status: Fetal Heart Rate (bpm): 127   Movement: Present     General:  Alert, oriented and cooperative. Patient is in no acute distress.  Skin: Skin is warm and dry. No rash noted.   Cardiovascular: Normal heart rate noted  Respiratory: Normal respiratory effort, no problems with respiration noted  Abdomen: Soft, gravid, appropriate for gestational age. Pain/Pressure: Absent     Pelvic: Vag. Bleeding: Scant     Speculum exam done, no evidence of blood in vaginal canal, vault or at os, os appears closed on visual inspection, copious amounts of thick greenish white discharge noted throughout vaginal canal         Extremities: Normal range of motion.  Edema: None  Mental Status: Normal mood and affect. Normal behavior. Normal judgment and thought content.    Assessment and Plan:  Pregnancy: G1P0000 at [redacted]w[redacted]d  1. Encounter for supervision of low-risk pregnancy in third trimester 2.  [redacted] weeks gestation of pregnancy Overall doing well. Denies any LOF and currently feels baby move. Denies any other concerns at this time. Would like Depo for birth control after delivery. - follow up in 2 weeks  3. Vaginal spotting Patient reports spotting yesterday, today resolved. Does have thick discharge likely yeast. Was recently treated for yeast infection with Terconazole cream (last week) and also had another round of Clotrimazole cream in August. Inquiring about other treatment. Discussed regarding Diflucan and pregnancy considerations/cautions and discussed pros and cons regarding treatment. Patient opts to try Diflucan - sent Diflucan to patient's preferred pharmacy - discussed in detail regarding return/MAU precautions including if having blood in pad/underwear  Preterm labor symptoms and general obstetric precautions including but not limited to vaginal bleeding, contractions, leaking of fluid and fetal movement were reviewed in detail with the patient. Please refer to After Visit Summary for other counseling recommendations.  Return in about 2 weeks (around 04/07/2021) for LROB, any provider.  Warner Mccreedy, MD, MPH OB Fellow, Faculty Practice

## 2021-03-25 ENCOUNTER — Inpatient Hospital Stay (HOSPITAL_COMMUNITY)
Admission: AD | Admit: 2021-03-25 | Discharge: 2021-03-25 | Disposition: A | Discharge status: Home / Self Care | Payer: Medicaid Other | Attending: Obstetrics and Gynecology | Admitting: Obstetrics and Gynecology

## 2021-03-25 ENCOUNTER — Encounter (HOSPITAL_COMMUNITY): Payer: Self-pay | Admitting: Obstetrics and Gynecology

## 2021-03-25 ENCOUNTER — Other Ambulatory Visit: Payer: Self-pay

## 2021-03-25 DIAGNOSIS — O98813 Other maternal infectious and parasitic diseases complicating pregnancy, third trimester: Secondary | ICD-10-CM | POA: Diagnosis not present

## 2021-03-25 DIAGNOSIS — Z3A32 32 weeks gestation of pregnancy: Secondary | ICD-10-CM

## 2021-03-25 DIAGNOSIS — B379 Candidiasis, unspecified: Secondary | ICD-10-CM | POA: Diagnosis not present

## 2021-03-25 DIAGNOSIS — B3731 Acute candidiasis of vulva and vagina: Secondary | ICD-10-CM | POA: Diagnosis not present

## 2021-03-25 DIAGNOSIS — O26893 Other specified pregnancy related conditions, third trimester: Secondary | ICD-10-CM | POA: Diagnosis not present

## 2021-03-25 DIAGNOSIS — Z3493 Encounter for supervision of normal pregnancy, unspecified, third trimester: Secondary | ICD-10-CM

## 2021-03-25 LAB — URINALYSIS, ROUTINE W REFLEX MICROSCOPIC
Bilirubin Urine: NEGATIVE
Glucose, UA: NEGATIVE mg/dL
Hgb urine dipstick: NEGATIVE
Ketones, ur: NEGATIVE mg/dL
Nitrite: NEGATIVE
Protein, ur: NEGATIVE mg/dL
Specific Gravity, Urine: 1.002 — ABNORMAL LOW (ref 1.005–1.030)
pH: 6 (ref 5.0–8.0)

## 2021-03-25 LAB — AMNISURE RUPTURE OF MEMBRANE (ROM) NOT AT ARMC: Amnisure ROM: NEGATIVE

## 2021-03-25 NOTE — MAU Provider Note (Signed)
Patient Michelle Stewart is a 20 y.o. G1P0000 at [redacted]w[redacted]d here with complaints of LOF. She was seen yesterday at the [facility] , she had a complaint of spotting and was examined by Dr. Ephriam Jenkins. Per chart review, patient had no bleeding yesterday. She was given a Diflucna RX for yeast infection yesterday.   This morning, she noticed a little bit of bleeding when she wiped  so she came in to be seen.  She denies contractions, abdominal pain, decreased fetal movements, diarrhea, fever, SOB.  History     CSN: [id number]  Arrival date and time: 03/25/21 0900   Event Date/Time   First Provider Initiated Contact with Patient 03/25/21 0947      No chief complaint on file.  Vaginal Discharge The patient's primary symptoms include vaginal discharge. The patient's pertinent negatives include no vaginal bleeding. This is a new problem. The problem occurs constantly. She is pregnant. Pertinent negatives include no abdominal pain, back pain, constipation, diarrhea, dysuria, fever, sore throat or urgency. The vaginal discharge was copious, mucoid, watery, white, thick, yellow and thin. The vaginal bleeding is spotting. She has not been passing clots. She has not been passing tissue. Nothing aggravates the symptoms.   OB History     Gravida  1   Para  0   Term  0   Preterm  0   AB  0   Living  0      SAB  0   IAB  0   Ectopic  0   Multiple  0   Live Births  0           History reviewed. No pertinent past medical history.  Past Surgical History:  Procedure Laterality Date   MANDIBLE SURGERY      Family History  Problem Relation Age of Onset   Healthy Mother    Healthy Father     Social History   Tobacco Use   Smoking status: Never   Smokeless tobacco: Never  Vaping Use   Vaping Use: Never used  Substance Use Topics   Alcohol use: Never   Drug use: Never    Allergies:  Allergies  Allergen Reactions   Albolene Swelling and Rash   Lactose Diarrhea and Other (See  Comments)    constipation     Medications Prior to Admission  Medication Sig Dispense Refill Last Dose   fluconazole (DIFLUCAN) 150 MG tablet Take 1 tablet (150 mg total) by mouth daily. Take one tablet by mouth. If it doesn't get better in 3 days you can take the second tablet 2 tablet 0 03/24/2021   Prenatal Vit-Fe Fumarate-FA (PREPLUS) 27-1 MG TABS Take 1 tablet by mouth daily. 30 tablet 0 03/24/2021   terconazole (TERAZOL 7) 0.4 % vaginal cream Place 1 applicator vaginally at bedtime. 45 g 0 Past Week    Review of Systems  Constitutional:  Negative for fever.  HENT:  Negative for sore throat.   Gastrointestinal:  Negative for abdominal pain, constipation and diarrhea.  Genitourinary:  Positive for vaginal discharge. Negative for dysuria and urgency.  Musculoskeletal:  Negative for back pain.  Physical Exam   Blood pressure 95/62, pulse (!) 143, temperature 98.1 F (36.7 C), temperature source Oral, resp. rate 15, last menstrual period 08/02/2020, SpO2 98 %.  Physical Exam Constitutional:      Appearance: Normal appearance.  Genitourinary:    Comments: NEFG; thick white discharge with watery discharge in the vagina, no blood in the vaginal vault.  Musculoskeletal:  General: Normal range of motion.     Cervical back: Normal range of motion.  Skin:    General: Skin is warm.  Neurological:     General: No focal deficit present.     Mental Status: She is alert.  Psychiatric:        Mood and Affect: Mood normal.        Behavior: Behavior normal.    MAU Course  Procedures  MDM -NST: 120 bpm, no decels, present acels, uterine irritability that patient does not feel -Heart rate was elevated while in MAU; EKG was done and showed normal sinus rhythm  -patient denies any cardiac complaints (SOB, chest pain, feelings of dizziness) -UA is reassuring, no signs of infection -amnisure negative   -no bleeding on exam Assessment and Plan   1. Encounter for supervision of  low-risk pregnancy in third trimester   2. Yeast infection     -will send for yeast typing due to reoccurrence of yeast -continue diflucan for now, patient knows that she may need to take new/different medicine depending on type of yeast. -given no active bleeding while in MAU, patient is stable for discharge with plans to keep appt on 10-25 -reviewed that I will call her with results and to change medicine if necessary.  -patient is talking and laughing and drinking water upon discharge. No distress or pain at this time.  Charlesetta Garibaldi Didier Brandenburg 03/25/2021, 11:06 AM

## 2021-03-25 NOTE — MAU Note (Signed)
.  Michelle Stewart is a 20 y.o. at [redacted]w[redacted]d here in MAU reporting: having continuous spotting with her yeast infection. States she completed the medication and the cream. Now her sx are worse with urinary frequency and pain in her vagina. Denies LOF. Endorses good fetal movement.   Pain score: 8  FHT:126 Lab orders placed from triage:  UA

## 2021-03-27 LAB — AEROBIC CULTURE W GRAM STAIN (SUPERFICIAL SPECIMEN)

## 2021-03-31 ENCOUNTER — Encounter: Payer: Self-pay | Admitting: Student

## 2021-03-31 ENCOUNTER — Telehealth: Payer: Self-pay | Admitting: Student

## 2021-03-31 ENCOUNTER — Other Ambulatory Visit: Payer: Self-pay | Admitting: Student

## 2021-03-31 DIAGNOSIS — B379 Candidiasis, unspecified: Secondary | ICD-10-CM | POA: Insufficient documentation

## 2021-03-31 MED ORDER — NYSTATIN-TRIAMCINOLONE 100000-0.1 UNIT/GM-% EX OINT: 1.0000 "application " | TOPICAL_OINTMENT | Freq: Two times a day (BID) | CUTANEOUS | 0 refills | Status: DC

## 2021-03-31 NOTE — Telephone Encounter (Signed)
TC to patient to discuss new treatment options  after conversation with Dr. Para March. Rec is for 14 days of terazol internal suppositories plus external triamcinolone and nystatin. Patient states that most of her symptoms have now resolved, and she does not need any more creams. She has a remaining Diflucan pill; I recommended that if she develops any symptoms she take Diflucan right away. Patient verbalized understanding and agreed with plan of care.   Michelle Stewart

## 2021-04-02 ENCOUNTER — Encounter: Payer: Self-pay | Admitting: *Deleted

## 2021-04-07 ENCOUNTER — Ambulatory Visit (INDEPENDENT_AMBULATORY_CARE_PROVIDER_SITE_OTHER): Payer: Medicaid Other | Admitting: Obstetrics & Gynecology

## 2021-04-07 ENCOUNTER — Encounter: Payer: Self-pay | Admitting: Obstetrics & Gynecology

## 2021-04-07 ENCOUNTER — Other Ambulatory Visit: Payer: Self-pay

## 2021-04-07 VITALS — BP 109/75 | HR 112 | Wt 133.3 lb

## 2021-04-07 DIAGNOSIS — Z3A34 34 weeks gestation of pregnancy: Secondary | ICD-10-CM

## 2021-04-07 DIAGNOSIS — Z3493 Encounter for supervision of normal pregnancy, unspecified, third trimester: Secondary | ICD-10-CM

## 2021-04-07 NOTE — Progress Notes (Signed)
   PRENATAL VISIT NOTE  Subjective:  Michelle Stewart is a 20 y.o. G1P0000 at [redacted]w[redacted]d being seen today for ongoing prenatal care.  She is currently monitored for the following issues for this low-risk pregnancy and has Adjustment disorder with mixed disturbance of emotions and conduct; Supervision of low-risk pregnancy; UTI (urinary tract infection) during pregnancy; and Yeast infection on their problem list.  Patient reports no complaints.  Contractions: Irritability. Vag. Bleeding: None.  Movement: Present. Denies leaking of fluid.   The following portions of the patient's history were reviewed and updated as appropriate: allergies, current medications, past family history, past medical history, past social history, past surgical history and problem list.   Objective:   Vitals:   04/07/21 1540  BP: 109/75  Pulse: (!) 112  Weight: 133 lb 4.8 oz (60.5 kg)    Fetal Status: Fetal Heart Rate (bpm): 135 Fundal Height: 33 cm Movement: Present     General:  Alert, oriented and cooperative. Patient is in no acute distress.  Skin: Skin is warm and dry. No rash noted.   Cardiovascular: Normal heart rate noted  Respiratory: Normal respiratory effort, no problems with respiration noted  Abdomen: Soft, gravid, appropriate for gestational age.  Pain/Pressure: Present     Pelvic: Cervical exam deferred        Extremities: Normal range of motion.  Edema: None  Mental Status: Normal mood and affect. Normal behavior. Normal judgment and thought content.   Assessment and Plan:  Pregnancy: G1P0000 at [redacted]w[redacted]d 1. [redacted] weeks gestation of pregnancy 2. Encounter for supervision of low-risk pregnancy in third trimester No issues. Pelvic cultures next visit. Preterm labor symptoms and general obstetric precautions including but not limited to vaginal bleeding, contractions, leaking of fluid and fetal movement were reviewed in detail with the patient. Please refer to After Visit Summary for other counseling  recommendations.   Return in about 2 weeks (around 04/21/2021) for Pelvic cultures, OFFICE OB VISIT (MD or APP).  No future appointments.  Jaynie Collins, MD

## 2021-04-07 NOTE — Patient Instructions (Signed)
Return to office for any scheduled appointments. Call the office or go to the MAU at [facility] [place] if:  You begin to have strong, frequent contractions  Your water breaks.  Sometimes it is a big gush of fluid, sometimes it is just a trickle that keeps getting your panties wet or running down your legs  You have vaginal bleeding.  It is normal to have a small amount of spotting if your cervix was checked.   You do not feel your baby moving like normal.  If you do not, get something to eat and drink and lay down and focus on feeling your baby move.   If your baby is still not moving like normal, you should call the office or go to MAU.  Any other obstetric concerns.   

## 2021-04-16 ENCOUNTER — Encounter (HOSPITAL_COMMUNITY): Payer: Self-pay | Admitting: Obstetrics & Gynecology

## 2021-04-16 ENCOUNTER — Inpatient Hospital Stay (HOSPITAL_COMMUNITY)
Admission: AD | Admit: 2021-04-16 | Discharge: 2021-04-16 | Disposition: A | Discharge status: Home / Self Care | Payer: Medicaid Other | Attending: Obstetrics & Gynecology | Admitting: Obstetrics & Gynecology

## 2021-04-16 ENCOUNTER — Ambulatory Visit: Payer: Self-pay

## 2021-04-16 DIAGNOSIS — Z3493 Encounter for supervision of normal pregnancy, unspecified, third trimester: Secondary | ICD-10-CM

## 2021-04-16 DIAGNOSIS — Z3A35 35 weeks gestation of pregnancy: Secondary | ICD-10-CM | POA: Insufficient documentation

## 2021-04-16 DIAGNOSIS — O4703 False labor before 37 completed weeks of gestation, third trimester: Secondary | ICD-10-CM | POA: Diagnosis not present

## 2021-04-16 LAB — URINALYSIS, ROUTINE W REFLEX MICROSCOPIC
Bacteria, UA: NONE SEEN
Bilirubin Urine: NEGATIVE
Glucose, UA: NEGATIVE mg/dL
Hgb urine dipstick: NEGATIVE
Ketones, ur: 5 mg/dL — AB
Nitrite: NEGATIVE
Protein, ur: NEGATIVE mg/dL
Specific Gravity, Urine: 1.012 (ref 1.005–1.030)
pH: 6 (ref 5.0–8.0)

## 2021-04-16 LAB — WET PREP, GENITAL
Clue Cells Wet Prep HPF POC: NONE SEEN
Sperm: NONE SEEN
Trich, Wet Prep: NONE SEEN
Yeast Wet Prep HPF POC: NONE SEEN

## 2021-04-16 NOTE — MAU Note (Signed)
Received 20 G1P0 35 4/7 weeks, pt c/o contractions and said not braxton hicks, reports that ctx are 4 minutes apart, pain scale 4/10, pt also report pain to lower back, 6/10 scale and pain started about 5pm today, denies any vaginal bleeding or leakage of fluid, + FM, support person at bedside, safety maintained.

## 2021-04-16 NOTE — Telephone Encounter (Signed)
Patient called and says she has been having what she thinks is Deberah Pelton since 1704 today. She says she's been timing them and they are every 3 minutes apart and lasts about 1-2 minutes. She says her abdomen gets tight, then it releases, but she still has uncomfortable feeling. The pain is a 4/10 to the abdomen, lower back, pelvic/vagina, and rectal area. She denies any other symptoms. She says she took a shower with no relief of the contractions she's feeling. She says she tried walking around, but had to sit down. She says the baby is slightly not as active, but is moving. Advised to go to the Maternity ED at [facility] and another adult drive, patient verbalized understanding.   Reason for Disposition  MODERATE-SEVERE abdominal pain  Answer Assessment - Initial Assessment Questions 1. LOCATION: "Where does it hurt?"      Stomach, lower back, vaginal area 2. RADIATION: "Does the pain shoot anywhere else?" (e.g., chest, back)     No 3. ONSET: "When did the pain begin?" (Minutes, hours or days ago)      1704 today 4. ONSET: "Gradual or sudden onset?"     Sudden 5. PATTERN: "Does the pain come and go, or has it been constant since it started?"      About 3 minutes lasting 1-2 minutes  6. SEVERITY: "How bad is the pain?" "What does it keep you from doing?"  (e.g., Scale 1-10; mild, moderate, or severe)   - MILD (1-3): doesn't interfere with normal activities, abdomen soft and not tender to touch    - MODERATE (4-7): interferes with normal activities or awakens from sleep, abdomen tender to touch    - SEVERE (8-10): excruciating pain, doubled over, unable to do any normal activities     4 7. RECURRENT SYMPTOM: "Have you ever had this type of stomach pain before?" If Yes, ask: "When was the last time?" and "What happened that time?"      No 8. CAUSE: "What do you think is causing the stomach pain?     Think it may be braxton hicks 9. RELIEVING/AGGRAVATING FACTORS: "What makes it better or  worse?" (e.g., antacids, bowel movement, movement)     Nothing 10. FETAL MOVEMENT: "Has the baby's movement decreased or changed significantly from normal?"       Not as active, small decrease in movement 11. OTHER SYMPTOMS: "Do you have any other symptoms?" (e.g., back pain, diarrhea, fever, urination pain, vaginal discharge, vomiting)       Lower back pain, pelvic pain, cramps in butt 12. EDD: "What date are you expecting to deliver?"       05/17/21  Answer Assessment - Initial Assessment Questions 1. ONSET: "When did the symptoms begin?"        Today at 1704 2. CONTRACTIONS: "Describe the contractions that you are having." (e.g., duration, frequency, regularity, severity)     Every 3 minutes lasting 1 minute, 4 in pain level, regular since it started 3. EDD: "What date are you expecting to deliver?"     05/17/21 4. PARITY: "Have you had a baby before?" If Yes, ask: "How long did the labor last?"     No 5. FETAL MOVEMENT: "Has the baby's movement decreased or changed significantly from normal?"     Slight decrease, not as active 6. OTHER SYMPTOMS: "Do you have any other symptoms?" (e.g., leaking fluid from vagina, fever, hand/facial swelling)     Lower back pain, pelvic/vaginal pain, pain in rectum area  Protocols used: Pregnancy - Abdominal Pain Greater Than [redacted] Weeks EGA-A-AH, Pregnancy - Labor - Preterm-A-AH

## 2021-04-16 NOTE — MAU Note (Signed)
PT SAYS SHE FEELS UC -  AFTER HAVING SEX - REG AND HURT- STARTED 5PM  PNC - WITH CLINIC

## 2021-04-16 NOTE — MAU Note (Signed)
PO hydration initiated, water pitcher given

## 2021-04-16 NOTE — MAU Provider Note (Signed)
Obstetric Attending MAU Note  Chief Complaint:  Abdominal Pain   Event Date/Time   First Provider Initiated Contact with Patient 04/16/21 2225     HPI: Michelle Stewart is a 20 y.o. G1P0000 at [redacted]w[redacted]d who presents to maternity admissions reporting frequent contractions about every 5 minutes.  Started around 5 pm today. Had intercourse shortly before contractions started. Has been keeping hydrated.  Denies leakage of fluid or vaginal bleeding. Good fetal movement.   Pregnancy Course: Receives care at [facility] Patient Active Problem List   Diagnosis Date Noted   Yeast infection 03/31/2021   UTI (urinary tract infection) during pregnancy 02/02/2021   Supervision of low-risk pregnancy 10/08/2020   Adjustment disorder with mixed disturbance of emotions and conduct 04/20/2018    History reviewed. No pertinent past medical history.  OB History  Gravida Para Term Preterm AB Living  1 0 0 0 0 0  SAB IAB Ectopic Multiple Live Births  0 0 0 0 0    # Outcome Date GA Lbr Len/2nd Weight Sex Delivery Anes PTL Lv  1 Current             Past Surgical History:  Procedure Laterality Date   MANDIBLE SURGERY      Family History: Family History  Problem Relation Age of Onset   Healthy Mother    Healthy Father     Social History: Social History   Tobacco Use   Smoking status: Never   Smokeless tobacco: Never  Vaping Use   Vaping Use: Never used  Substance Use Topics   Alcohol use: Never   Drug use: Never    Allergies:  Allergies  Allergen Reactions   Albolene Swelling and Rash   Lactose Diarrhea and Other (See Comments)    constipation     Medications Prior to Admission  Medication Sig Dispense Refill Last Dose   prenatal vitamin w/FE, FA (PRENATAL 1 + 1) 27-1 MG TABS tablet Take 1 tablet by mouth daily at 12 noon.   04/16/2021   fluconazole (DIFLUCAN) 150 MG tablet Take 1 tablet (150 mg total) by mouth daily. Take one tablet by mouth. If it doesn't get better in 3 days you  can take the second tablet 2 tablet 0 Unknown   nystatin-triamcinolone ointment (MYCOLOG) Apply 1 application topically 2 (two) times daily. 30 g 0 Unknown    ROS: Pertinent findings in history of present illness.  Physical Exam  Blood pressure 110/69, pulse (!) 108, temperature (!) 97.5 F (36.4 C), temperature source Oral, resp. rate 18, height 5\' 1"  (1.549 m), weight 62.2 kg, last menstrual period 08/02/2020, SpO2 100 %. CONSTITUTIONAL: Well-developed, well-nourished female in no acute distress.  HENT:  Normocephalic, atraumatic, External right and left ear normal.  EYES: Conjunctivae and EOM are normal. Pupils are equal, round, and reactive to light. No scleral icterus.  NECK: Normal range of motion, supple, no masses SKIN: Skin is warm and dry. No rash noted. Not diaphoretic. No erythema. No pallor. NEUROLGIC: Alert and oriented to person, place, and time. Normal reflexes, muscle tone coordination. No cranial nerve deficit noted. PSYCHIATRIC: Normal mood and affect. Normal behavior. Normal judgment and thought content. CARDIOVASCULAR: Normal heart rate noted, regular rhythm RESPIRATORY: Effort and breath sounds normal, no problems with respiration noted ABDOMEN: Soft, nontender, nondistended, gravid appropriate for gestational age MUSCULOSKELETAL: Normal range of motion. No edema and no tenderness. 2+ distal pulses.  PELVIC EXAM: NEFG, physiologic discharge, no blood Dilation: Closed Effacement (%): 30 Cervical Position: Posterior Station: 08/04/2020  Presentation: Vertex Exam by:: Dr. Macon Large  FHT:  Baseline 125 , moderate variability, accelerations present, no decelerations Contractions: q 3-5 mins   Labs: Results for orders placed or performed during the hospital encounter of 04/16/21 (from the past 24 hour(s))  Urinalysis, Routine w reflex microscopic Vaginal/Rectal     Status: Abnormal   Collection Time: 04/16/21 10:14 PM  Result Value Ref Range   Color, Urine YELLOW  YELLOW   APPearance CLEAR CLEAR   Specific Gravity, Urine 1.012 1.005 - 1.030   pH 6.0 5.0 - 8.0   Glucose, UA NEGATIVE NEGATIVE mg/dL   Hgb urine dipstick NEGATIVE NEGATIVE   Bilirubin Urine NEGATIVE NEGATIVE   Ketones, ur 5 (A) NEGATIVE mg/dL   Protein, ur NEGATIVE NEGATIVE mg/dL   Nitrite NEGATIVE NEGATIVE   Leukocytes,Ua TRACE (A) NEGATIVE   RBC / HPF 0-5 0 - 5 RBC/hpf   WBC, UA 0-5 0 - 5 WBC/hpf   Bacteria, UA NONE SEEN NONE SEEN   Squamous Epithelial / LPF 0-5 0 - 5   Mucus PRESENT   Wet prep, genital     Status: Abnormal   Collection Time: 04/16/21 10:20 PM   Specimen: Vaginal/Rectal; Genital  Result Value Ref Range   Yeast Wet Prep HPF POC NONE SEEN NONE SEEN   Trich, Wet Prep NONE SEEN NONE SEEN   Clue Cells Wet Prep HPF POC NONE SEEN NONE SEEN   WBC, Wet Prep HPF POC FEW (A) NONE SEEN   Sperm NONE SEEN     Imaging:  No results found.  MAU Course: 2215 Cervix checked, pelvic cultures (wet prep, GC/Chlam, GBS) obtained. Urinalysis done.  Cervix closed/40/high 2300 Patient desires to go home. Negative wet prep and UA, other cultures pending   Assessment: 1. Preterm uterine contractions in third trimester, antepartum   2. Encounter for supervision of low-risk pregnancy in third trimester   3. [redacted] weeks gestation of pregnancy     Plan: Discharge home Preterm labor precautions and fetal movement precautions reviewed Follow up with OB provider as scheduled Will follow up pelvic cultures and manage accordingly.    Allergies as of 04/16/2021       Reactions   Albolene Swelling, Rash   Lactose Diarrhea, Other (See Comments)   constipation        Medication List     STOP taking these medications    fluconazole 150 MG tablet Commonly known as: Diflucan   nystatin-triamcinolone ointment Commonly known as: MYCOLOG       TAKE these medications    prenatal vitamin w/FE, FA 27-1 MG Tabs tablet Take 1 tablet by mouth daily at 12 noon.         Tereso Newcomer, MD 04/16/2021 11:02 PM

## 2021-04-17 LAB — GC/CHLAMYDIA PROBE AMP (~~LOC~~) NOT AT ARMC
Chlamydia: NEGATIVE
Comment: NEGATIVE
Comment: NORMAL
Neisseria Gonorrhea: NEGATIVE

## 2021-04-19 LAB — CULTURE, BETA STREP (GROUP B ONLY)

## 2021-04-19 LAB — OB RESULTS CONSOLE GBS: GBS: NEGATIVE

## 2021-04-22 ENCOUNTER — Ambulatory Visit (INDEPENDENT_AMBULATORY_CARE_PROVIDER_SITE_OTHER): Payer: Medicaid Other | Admitting: Family Medicine

## 2021-04-22 ENCOUNTER — Encounter: Payer: Self-pay | Admitting: Family Medicine

## 2021-04-22 ENCOUNTER — Other Ambulatory Visit: Payer: Self-pay

## 2021-04-22 VITALS — BP 118/81 | HR 80 | Wt 138.3 lb

## 2021-04-22 DIAGNOSIS — Z3493 Encounter for supervision of normal pregnancy, unspecified, third trimester: Secondary | ICD-10-CM

## 2021-04-22 NOTE — Patient Instructions (Signed)

## 2021-04-22 NOTE — Progress Notes (Signed)
   Subjective:  Michelle Stewart is a 20 y.o. G1P0000 at [redacted]w[redacted]d being seen today for ongoing prenatal care.  She is currently monitored for the following issues for this low-risk pregnancy and has Adjustment disorder with mixed disturbance of emotions and conduct; Supervision of low-risk pregnancy; UTI (urinary tract infection) during pregnancy; and Yeast infection on their problem list.  Patient reports no complaints.  Contractions: Irregular. Vag. Bleeding: None.  Movement: Present. Denies leaking of fluid.   The following portions of the patient's history were reviewed and updated as appropriate: allergies, current medications, past family history, past medical history, past social history, past surgical history and problem list. Problem list updated.  Objective:   Vitals:   04/22/21 1546  BP: 118/81  Pulse: 80  Weight: 138 lb 4.8 oz (62.7 kg)    Fetal Status: Fetal Heart Rate (bpm): 133   Movement: Present  Presentation: Vertex  General:  Alert, oriented and cooperative. Patient is in no acute distress.  Skin: Skin is warm and dry. No rash noted.   Cardiovascular: Normal heart rate noted  Respiratory: Normal respiratory effort, no problems with respiration noted  Abdomen: Soft, gravid, appropriate for gestational age. Pain/Pressure: Present     Pelvic: Vag. Bleeding: None     Cervical exam performed Dilation: Closed Effacement (%): Thick Station: -2  Extremities: Normal range of motion.  Edema: None  Mental Status: Normal mood and affect. Normal behavior. Normal judgment and thought content.   Urinalysis:      Assessment and Plan:  Pregnancy: G1P0000 at [redacted]w[redacted]d  1. Encounter for supervision of low-risk pregnancy in third trimester BP and FHR normal Swabs already done in MAU on 04/16/21, GBS neg Cervix checked per request, still closed  Preterm labor symptoms and general obstetric precautions including but not limited to vaginal bleeding, contractions, leaking of fluid and fetal  movement were reviewed in detail with the patient. Please refer to After Visit Summary for other counseling recommendations.  Return in 1 week (on 04/29/2021) for [facility], ob visit.   Venora Maples, MD

## 2021-04-27 ENCOUNTER — Telehealth: Payer: Self-pay | Admitting: Family Medicine

## 2021-04-27 NOTE — Telephone Encounter (Signed)
Patient called asking to speak to the nurse doing calls because she had more questions from the MyChart conversation that was taking place and she had become confused.

## 2021-04-27 NOTE — Telephone Encounter (Signed)
Called pt. Pt states she thinks she is having contractions every 15 minutes, but has not timed contractions. Has been experiencing contractions overnight and this morning. Reports increased pelvic pressure. Reports continued good fetal movement. Denies vaginal bleeding or leaking of fluid. Also reports some feelings of uterine cramps between contractions. Encouraged pt to drink a few glasses of water and to take 2 extra strength Tylenol. Instructed pt to record contractions for the next hour and send MyChart message. Pt would like to be seen to see if her cervix is dilating. Explained we can determine need for appointment once we know how frequently contractions are happening.

## 2021-04-29 ENCOUNTER — Ambulatory Visit (INDEPENDENT_AMBULATORY_CARE_PROVIDER_SITE_OTHER): Payer: Medicaid Other | Admitting: Certified Nurse Midwife

## 2021-04-29 ENCOUNTER — Other Ambulatory Visit: Payer: Self-pay

## 2021-04-29 VITALS — BP 111/70 | HR 92 | Wt 139.6 lb

## 2021-04-29 DIAGNOSIS — F5104 Psychophysiologic insomnia: Secondary | ICD-10-CM

## 2021-04-29 DIAGNOSIS — Z3493 Encounter for supervision of normal pregnancy, unspecified, third trimester: Secondary | ICD-10-CM

## 2021-04-29 DIAGNOSIS — Z3A37 37 weeks gestation of pregnancy: Secondary | ICD-10-CM

## 2021-04-29 MED ORDER — MAGNESIUM OXIDE -MG SUPPLEMENT 200 MG PO TABS: 400.0000 mg | ORAL_TABLET | Freq: Every day | ORAL | 3 refills | Status: DC

## 2021-04-29 MED ORDER — HYDROXYZINE HCL 10 MG PO TABS
10.0000 mg | ORAL_TABLET | Freq: Three times a day (TID) | ORAL | 0 refills | Status: DC | PRN
Start: 2021-04-29 — End: 2021-05-04

## 2021-04-30 ENCOUNTER — Telehealth: Payer: Self-pay | Admitting: Family Medicine

## 2021-04-30 NOTE — Telephone Encounter (Signed)
Patient want to know if she can come in to see if she has dilated.

## 2021-04-30 NOTE — Telephone Encounter (Signed)
Returned patients call. She did not answer. LM for her to call the office at [phone] if she has further questions or concerns.

## 2021-05-01 NOTE — Progress Notes (Signed)
   PRENATAL VISIT NOTE  Subjective:  Michelle Stewart is a 20 y.o. G1P0000 at [redacted]w[redacted]d being seen today for ongoing prenatal care.  She is currently monitored for the following issues for this low-risk pregnancy and has Adjustment disorder with mixed disturbance of emotions and conduct; Supervision of low-risk pregnancy; UTI (urinary tract infection) during pregnancy; and Yeast infection on their problem list.  Patient reports  feeling miserable because she is having a hard time sleeping in these last weeks of pregnancy .  Contractions: Irregular. Vag. Bleeding: None.  Movement: Present. Denies leaking of fluid.   The following portions of the patient's history were reviewed and updated as appropriate: allergies, current medications, past family history, past medical history, past social history, past surgical history and problem list.   Objective:   Vitals:   04/29/21 1332  BP: 111/70  Pulse: 92  Weight: 139 lb 9.6 oz (63.3 kg)    Fetal Status: Fetal Heart Rate (bpm): 138 Fundal Height: 37 cm Movement: Present     General:  Alert, oriented and cooperative. Patient is in no acute distress.  Skin: Skin is warm and dry. No rash noted.   Cardiovascular: Normal heart rate noted  Respiratory: Normal respiratory effort, no problems with respiration noted  Abdomen: Soft, gravid, appropriate for gestational age.  Pain/Pressure: Absent     Pelvic: Cervical exam deferred        Extremities: Normal range of motion.  Edema: Trace  Mental Status: Normal mood and affect. Normal behavior. Normal judgment and thought content.   Assessment and Plan:  Pregnancy: G1P0000 at [redacted]w[redacted]d 1. Supervision of low-risk pregnancy, third trimester - Feeling regular and vigorous fetal movement  2. [redacted] weeks gestation of pregnancy - Routine OB care   3. Psychophysiological insomnia - Magnesium Oxide (MAG-OXIDE) 200 MG TABS; Take 2 tablets (400 mg total) by mouth at bedtime. If that amount causes loose stools in the  am, switch to 200mg  daily at bedtime.  Dispense: 60 tablet; Refill: 3 - hydrOXYzine (ATARAX/VISTARIL) 10 MG tablet; Take 1 tablet (10 mg total) by mouth 3 (three) times daily as needed.  Dispense: 30 tablet; Refill: 0  Term labor symptoms and general obstetric precautions including but not limited to vaginal bleeding, contractions, leaking of fluid and fetal movement were reviewed in detail with the patient. Please refer to After Visit Summary for other counseling recommendations.   Return for IN-PERSON, LOB.  Future Appointments  Date Time Provider Department Center  05/12/2021  2:35 PM Rasch, 05/14/2021, NP [facility]  05/19/2021  2:15 PM 14/11/2020, CNM [facility] [address] [facility]    [facility] CNM

## 2021-05-04 ENCOUNTER — Ambulatory Visit (INDEPENDENT_AMBULATORY_CARE_PROVIDER_SITE_OTHER): Payer: Medicaid Other | Admitting: General Practice

## 2021-05-04 ENCOUNTER — Other Ambulatory Visit: Payer: Self-pay

## 2021-05-04 VITALS — BP 120/69 | HR 89 | Wt 138.0 lb

## 2021-05-04 DIAGNOSIS — O368130 Decreased fetal movements, third trimester, not applicable or unspecified: Secondary | ICD-10-CM

## 2021-05-04 NOTE — Progress Notes (Signed)
Patient presents to office today reporting off/on decreased fetal movement since Friday evening. Patient reports feeling good fetal movement one day and the next she will only feel baby move a couple  times. Last OB visit was 11/16. NST reactive today. Advised patient if she hasn't felt baby move to lay down in a quiet room by herself without distractions like a tv or phone, drink something cold & get a snack. Told patient if an after an hour she hasn't felt good movement from baby to call us if we're open or go to MAU for evaluation. Patient verbalized understanding.  Chase Caller RN BSN 05/04/21

## 2021-05-05 ENCOUNTER — Encounter: Payer: Self-pay | Admitting: *Deleted

## 2021-05-10 ENCOUNTER — Other Ambulatory Visit: Payer: Self-pay

## 2021-05-10 ENCOUNTER — Encounter (HOSPITAL_COMMUNITY): Payer: Self-pay | Admitting: Obstetrics and Gynecology

## 2021-05-10 ENCOUNTER — Inpatient Hospital Stay (HOSPITAL_COMMUNITY)
Admission: AD | Admit: 2021-05-10 | Discharge: 2021-05-11 | Disposition: A | Discharge status: Home / Self Care | Payer: Medicaid Other | Attending: Obstetrics and Gynecology | Admitting: Obstetrics and Gynecology

## 2021-05-10 DIAGNOSIS — O471 False labor at or after 37 completed weeks of gestation: Secondary | ICD-10-CM | POA: Insufficient documentation

## 2021-05-10 DIAGNOSIS — O479 False labor, unspecified: Secondary | ICD-10-CM

## 2021-05-10 DIAGNOSIS — Z3689 Encounter for other specified antenatal screening: Secondary | ICD-10-CM

## 2021-05-10 DIAGNOSIS — Z3A39 39 weeks gestation of pregnancy: Secondary | ICD-10-CM | POA: Insufficient documentation

## 2021-05-10 NOTE — MAU Note (Signed)
Michelle Stewart is a 20 y.o. at [redacted]w[redacted]d here in MAU reporting: regular painful contractions that began at 1700 . Denies SROM, Vaginal bleeding or bloody show, endorses+ fetal movement.   Pt is G1P0 and denies problems or complications with pregnancy. Onset of complaint: 1700 Pain score: 8 Vitals:   05/10/21 2206  BP: 119/72  Pulse: (!) 106  Resp: 17  Temp: 98.2 F (36.8 C)  SpO2: 99%     FHT:141 Lab orders placed from triage:  MAU labor

## 2021-05-11 DIAGNOSIS — Z3689 Encounter for other specified antenatal screening: Secondary | ICD-10-CM | POA: Diagnosis not present

## 2021-05-11 DIAGNOSIS — Z3A39 39 weeks gestation of pregnancy: Secondary | ICD-10-CM | POA: Diagnosis not present

## 2021-05-11 DIAGNOSIS — O471 False labor at or after 37 completed weeks of gestation: Secondary | ICD-10-CM | POA: Diagnosis not present

## 2021-05-12 ENCOUNTER — Encounter: Payer: Medicaid Other | Admitting: Obstetrics and Gynecology

## 2021-05-12 ENCOUNTER — Other Ambulatory Visit: Payer: Self-pay

## 2021-05-12 ENCOUNTER — Ambulatory Visit (INDEPENDENT_AMBULATORY_CARE_PROVIDER_SITE_OTHER): Payer: Medicaid Other | Admitting: Obstetrics and Gynecology

## 2021-05-12 VITALS — BP 115/68 | HR 87 | Wt 139.1 lb

## 2021-05-12 DIAGNOSIS — Z3493 Encounter for supervision of normal pregnancy, unspecified, third trimester: Secondary | ICD-10-CM

## 2021-05-12 NOTE — Patient Instructions (Signed)
The [employer]  This circuit takes at least 90 minutes to complete so clear your schedule and make mental preparations so you can relax in your environment. The second step requires a lot of pillows so gather them up before beginning Before starting, you should empty your bladder! Have a nice drink nearby, and make sure it has a straw! If you are having contractions, this circuit should be done through contractions, try not to change positions between steps Before you begin...  "I named this 'circuit' after my friend Michelle Stewart, who shared and discussed it with me when I was working with a client whose labor seemed to be stalled out and no longer progressing... This circuit is useful to help get the baby lined up, ideally, in the "Left Occiput Anterior" (LOA) Position, both before labor begins and when some corrections need to be done during labor. Prenatally, this position set can help to rotate a baby. As a natural method of induction, this can help get things going if baby just needed a gentle nudge of position to set things off. To the best of my knowledge, this group of positions will not "hurt" a baby that is already lined up correctly." - Michelle Stewart   Step One: Open-knee Chest Stay in this position for 30 minutes, start in cat/cow, then drop your chest as low as you can to the bed or the floor and your bottom as high as you can. Knees should be fairly wide apart, and the angle between the torso/thighs should be wider than 90 degrees. Wiggle around, prop with lots of pillows and use this time to get totally relaxed. This position allows the baby to scoot out of the pelvis a bit and gives them room to rotate, shift their head position, etc. If the pregnant person finds it helpful, careful positioning with a rebozo under the belly, with gentle tension from a support person behind can help maintain this position for the full 30 minutes.  Step Two:Exaggerated Left Side  Lying Roll to your left side, bringing your top leg as high as possible and keeping your bottom leg straight. Roll forward as much as possible, again using a lot of pillows. Sink into the bed and relax some more. If you fall asleep, that's totally okay and you can stay there! If not, stay here for at least another half an hour. Try and get your top right leg up towards your head and get as rolled over onto your belly as much as possible. If you repeat the circuit during labor, try alternating left and right sides. We know the photo the left is actually right side... just flip the image in your head.  Step Three: Moving and Lunges Lunge, walk stairs facing sideways, 2 at a time, (have a spotter downstairs of you!), take a walk outside with one foot on the curb and the other on the street, sit on a birth ball and hula- anything that's upright and putting your pelvis in open, asymmetrical positions. Spend at least 30 minutes doing this one as well to give your baby a chance to move down. If you are lunging or stair or curb walking, you should lunge/walk/go up stairs in the direction that feels better to you. The key with the lunge is that the toes of the higher leg and mom's belly button should be at right angles. Do not lunge over your knee, that closes the pelvis.     Michelle Hamilton Stewart: Circuit Creator - [email or web address] Michelle   Stewart, CD, BDT (DONA), LCCE, FACCE: Supporting Content - [email or web address] Emily Weaver Brown: Photography - [email or web address] Kate Dewey CD/CDT (BAI): Print and Webmaster - [email or web address] [employer] [email or web address]  

## 2021-05-12 NOTE — Progress Notes (Signed)
   PRENATAL VISIT NOTE  Subjective:  Michelle Stewart is a 20 y.o. G1P0000 at [redacted]w[redacted]d being seen today for ongoing prenatal care.  She is currently monitored for the following issues for this low-risk pregnancy and has Adjustment disorder with mixed disturbance of emotions and conduct; Supervision of low-risk pregnancy; UTI (urinary tract infection) during pregnancy; and Yeast infection on their problem list.  Patient reports + contractions.  Contractions: Irritability. Vag. Bleeding: None.  Movement: Present. Denies leaking of fluid.   The following portions of the patient's history were reviewed and updated as appropriate: allergies, current medications, past family history, past medical history, past social history, past surgical history and problem list.   Objective:   Vitals:   05/12/21 1332  BP: 115/68  Pulse: 87  Weight: 139 lb 1.6 oz (63.1 kg)    Fetal Status: Fetal Heart Rate (bpm): 138   Movement: Present  Presentation: Vertex  General:  Alert, oriented and cooperative. Patient is in no acute distress.  Skin: Skin is warm and dry. No rash noted.   Cardiovascular: Normal heart rate noted  Respiratory: Normal respiratory effort, no problems with respiration noted  Abdomen: Soft, gravid, appropriate for gestational age.  Pain/Pressure: Present     Pelvic: Cervical exam performed in the presence of a chaperone Dilation: 1 Effacement (%): 60 Station: -2 Membranes stripped per patient request.   Extremities: Normal range of motion.  Edema: Trace  Mental Status: Normal mood and affect. Normal behavior. Normal judgment and thought content.   Assessment and Plan:  Pregnancy: G1P0000 at [redacted]w[redacted]d  1. Encounter for supervision of low-risk pregnancy in third trimester  Doing well, briefly discussed induction. Patient and mom decided they would like to wait another week and then discuss scheduling.  Miles circuit. NST/AFI next visit for post dates. Labor precautions GBS negative   TERM  labor symptoms and general obstetric precautions including but not limited to vaginal bleeding, contractions, leaking of fluid and fetal movement were reviewed in detail with the patient. Please refer to After Visit Summary for other counseling recommendations.   Return in about 1 week (around 05/19/2021), or For NST/AFI.  Future Appointments  Date Time Provider Department Center  05/19/2021  2:15 PM Armando Reichert, CNM [facility]    Venia Carbon, NP

## 2021-05-13 ENCOUNTER — Encounter (HOSPITAL_COMMUNITY): Payer: Self-pay | Admitting: Obstetrics and Gynecology

## 2021-05-13 ENCOUNTER — Telehealth: Payer: Self-pay | Admitting: Family Medicine

## 2021-05-13 ENCOUNTER — Inpatient Hospital Stay (HOSPITAL_COMMUNITY)
Admission: AD | Admit: 2021-05-13 | Discharge: 2021-05-13 | Disposition: A | Discharge status: Home / Self Care | Payer: Medicaid Other | Attending: Obstetrics and Gynecology | Admitting: Obstetrics and Gynecology

## 2021-05-13 DIAGNOSIS — O471 False labor at or after 37 completed weeks of gestation: Secondary | ICD-10-CM | POA: Insufficient documentation

## 2021-05-13 DIAGNOSIS — Z3A39 39 weeks gestation of pregnancy: Secondary | ICD-10-CM | POA: Insufficient documentation

## 2021-05-13 DIAGNOSIS — Z3493 Encounter for supervision of normal pregnancy, unspecified, third trimester: Secondary | ICD-10-CM

## 2021-05-13 DIAGNOSIS — O479 False labor, unspecified: Secondary | ICD-10-CM | POA: Diagnosis not present

## 2021-05-13 DIAGNOSIS — Z743 Need for continuous supervision: Secondary | ICD-10-CM | POA: Diagnosis not present

## 2021-05-13 DIAGNOSIS — O26899 Other specified pregnancy related conditions, unspecified trimester: Secondary | ICD-10-CM | POA: Diagnosis not present

## 2021-05-13 DIAGNOSIS — R6889 Other general symptoms and signs: Secondary | ICD-10-CM | POA: Diagnosis not present

## 2021-05-13 NOTE — Telephone Encounter (Signed)
I called Michelle Stewart back and we discussed she would like to have IOL now. We discussed is usually at 41 weeks, patient thought sooner. I informed her I will message her provider to see what she meant.  She also states she thinks she just passed her mucous plug. She reports she just got back from the hospital . She reports mucous with some blood when she wiped after urinating. She denies further bleeding. We discussed it could be mucous plug or it could be from her cervical exam. We discussed signs of labor or reasons to report to hospital including bleeding, contractions every 5 minutes,etc.  She voices understanding. Shamon Lobo,RN

## 2021-05-13 NOTE — Telephone Encounter (Signed)
Patient had an appt yesterday and they asked her if she wanted to schedule her induction and she said no, but now she wants to get it scheduled and wanted to talk to a nurse about it

## 2021-05-13 NOTE — MAU Note (Signed)
Pt was at work. Kept going to the BR thinking she was going to have diarrhea but nothing came out. Had feeling about q 20 min. Co-workers called 911 because they were worried the baby was coming. Had membranes sweep yesterday. Reports some bloody brown mucusy discharge. Good fetal movement reported. C/o increased pelvic pressure as well.

## 2021-05-13 NOTE — MAU Note (Signed)
I have communicated with K.Kooistra,CNM and reviewed vital signs:  Vitals:   05/13/21 1300 05/13/21 1439  BP: 114/80 112/62  Pulse:  (!) 103  Resp: (!) 92   Temp: 97.9 F (36.6 C)     Vaginal exam:  Dilation: 1.5 Effacement (%): 60 Cervical Position: Posterior Station: -2 Presentation: Vertex Exam by:: K.Jaxten Brosh,RN,   Also reviewed contraction pattern and that non-stress test is reactive.  It has been documented that patient is contracting every \\4 -6 minutes with no cervical change since exam yesterday. Not indicating active labor.  Patient denies any other complaints.  Based on this report provider has given order for discharge.  A discharge order and diagnosis entered by a provider.   Labor discharge instructions reviewed with patient.

## 2021-05-13 NOTE — MAU Provider Note (Signed)
S: [name] is a 20 y.o. G1P0000 at [redacted]w[redacted]d  who presents to MAU today for labor evaluation.     Cervical exam by RN:  Dilation: 1.5 Effacement (%): 60 Cervical Position: Posterior Station: -2 Presentation: Vertex Exam by:: K.Wilson,RN  Fetal Monitoring: Baseline: 120 Variability: mod Accelerations: present Decelerations: absent Contractions: uterine irritability  MDM Discussed patient with RN. NST reviewed.   A: SIUP at [redacted]w[redacted]d  False labor  P: Discharge home Labor precautions and kick counts included in AVS Patient to follow-up with [facility] next Tuesday as scheduled  Patient may return to MAU as needed or when in labor   Marylene Land, [place] 05/13/2021 2:37 PM

## 2021-05-14 ENCOUNTER — Telehealth: Payer: Self-pay | Admitting: *Deleted

## 2021-05-14 DIAGNOSIS — Z419 Encounter for procedure for purposes other than remedying health state, unspecified: Secondary | ICD-10-CM | POA: Diagnosis not present

## 2021-05-14 NOTE — Telephone Encounter (Signed)
Per message from Venia Carbon, NP patient may be induced anytime 40 weeks or later. I called and scheduled IOL for 05/19/21 0000. I called Sage and gave her the appointment. I explained the provider will decide on which type of induction she will undergo depending on her cervical dilation at the time. We talked about pitocin, and cytotec.  I also informed her to go to hospital if she goes into labor before then. She voices understanding. Alphonso Gregson,RN

## 2021-05-15 ENCOUNTER — Telehealth: Payer: Self-pay | Admitting: General Practice

## 2021-05-15 ENCOUNTER — Inpatient Hospital Stay (HOSPITAL_COMMUNITY)
Admission: AD | Admit: 2021-05-15 | Discharge: 2021-05-18 | DRG: 807 | Disposition: A | Discharge status: Home / Self Care | Payer: Medicaid Other | Attending: Obstetrics & Gynecology | Admitting: Obstetrics & Gynecology

## 2021-05-15 ENCOUNTER — Inpatient Hospital Stay (EMERGENCY_DEPARTMENT_HOSPITAL)
Admission: AD | Admit: 2021-05-15 | Discharge: 2021-05-15 | Disposition: A | Discharge status: Home / Self Care | Payer: Medicaid Other | Source: Home / Self Care | Attending: Obstetrics and Gynecology | Admitting: Obstetrics and Gynecology

## 2021-05-15 ENCOUNTER — Other Ambulatory Visit: Payer: Self-pay

## 2021-05-15 ENCOUNTER — Encounter (HOSPITAL_COMMUNITY): Payer: Self-pay | Admitting: Obstetrics and Gynecology

## 2021-05-15 ENCOUNTER — Other Ambulatory Visit: Payer: Self-pay | Admitting: Advanced Practice Midwife

## 2021-05-15 DIAGNOSIS — Z3493 Encounter for supervision of normal pregnancy, unspecified, third trimester: Secondary | ICD-10-CM

## 2021-05-15 DIAGNOSIS — O4202 Full-term premature rupture of membranes, onset of labor within 24 hours of rupture: Secondary | ICD-10-CM | POA: Diagnosis not present

## 2021-05-15 DIAGNOSIS — O479 False labor, unspecified: Secondary | ICD-10-CM

## 2021-05-15 DIAGNOSIS — Z3490 Encounter for supervision of normal pregnancy, unspecified, unspecified trimester: Secondary | ICD-10-CM

## 2021-05-15 DIAGNOSIS — O471 False labor at or after 37 completed weeks of gestation: Secondary | ICD-10-CM | POA: Insufficient documentation

## 2021-05-15 DIAGNOSIS — O4292 Full-term premature rupture of membranes, unspecified as to length of time between rupture and onset of labor: Principal | ICD-10-CM | POA: Diagnosis present

## 2021-05-15 DIAGNOSIS — Z3A39 39 weeks gestation of pregnancy: Secondary | ICD-10-CM

## 2021-05-15 DIAGNOSIS — Z20822 Contact with and (suspected) exposure to covid-19: Secondary | ICD-10-CM | POA: Diagnosis present

## 2021-05-15 DIAGNOSIS — F4325 Adjustment disorder with mixed disturbance of emotions and conduct: Secondary | ICD-10-CM | POA: Diagnosis present

## 2021-05-15 DIAGNOSIS — Z3480 Encounter for supervision of other normal pregnancy, unspecified trimester: Secondary | ICD-10-CM

## 2021-05-15 LAB — TYPE AND SCREEN
ABO/RH(D): A POS
Antibody Screen: NEGATIVE

## 2021-05-15 LAB — CBC
HCT: 32.7 % — ABNORMAL LOW (ref 36.0–46.0)
Hemoglobin: 10.5 g/dL — ABNORMAL LOW (ref 12.0–15.0)
MCH: 26.9 pg (ref 26.0–34.0)
MCHC: 32.1 g/dL (ref 30.0–36.0)
MCV: 83.8 fL (ref 80.0–100.0)
Platelets: 225 10*3/uL (ref 150–400)
RBC: 3.9 MIL/uL (ref 3.87–5.11)
RDW: 14.3 % (ref 11.5–15.5)
WBC: 14.6 10*3/uL — ABNORMAL HIGH (ref 4.0–10.5)
nRBC: 0 % (ref 0.0–0.2)

## 2021-05-15 LAB — RESP PANEL BY RT-PCR (FLU A&B, COVID) ARPGX2
Influenza A by PCR: NEGATIVE
Influenza B by PCR: NEGATIVE
SARS Coronavirus 2 by RT PCR: NEGATIVE

## 2021-05-15 LAB — POCT FERN TEST: POCT Fern Test: POSITIVE

## 2021-05-15 MED ORDER — LACTATED RINGERS IV SOLN
INTRAVENOUS | Status: DC
  Administered 2021-05-15: 1000 mL via INTRAVENOUS

## 2021-05-15 NOTE — H&P (Signed)
OBSTETRIC ADMISSION HISTORY AND PHYSICAL  Michelle Stewart is a 20 y.o. female G1P0000 with IUP at [redacted]w[redacted]d by early [place] presenting for PROM (~1600). She reports +FMs, no VB, no blurry vision, headaches or peripheral edema, and RUQ pain.  She plans on breast feeding. She request depo for birth control. She received her prenatal care at  [facility]    Dating: By early [place] --->  Estimated Date of Delivery: 05/17/21  Sono:   @[redacted]w[redacted]d , CWD, normal anatomy, cephalic presentation, anterior placental lie, 619 g, 55% EFW   Prenatal History/Complications:  UTI in this pregnancy  Past Medical History: No past medical history on file.  Past Surgical History: Past Surgical History:  Procedure Laterality Date   MANDIBLE SURGERY      Obstetrical History: OB History     Gravida  1   Para  0   Term  0   Preterm  0   AB  0   Living  0      SAB  0   IAB  0   Ectopic  0   Multiple  0   Live Births  0           Social History Social History   Socioeconomic History   Marital status: Single    Spouse name: Not on file   Number of children: Not on file   Years of education: Not on file   Highest education level: Not on file  Occupational History   Not on file  Tobacco Use   Smoking status: Never   Smokeless tobacco: Never  Vaping Use   Vaping Use: Never used  Substance and Sexual Activity   Alcohol use: Never   Drug use: Never   Sexual activity: Yes    Birth control/protection: None  Other Topics Concern   Not on file  Social History Narrative   Not on file   Social Determinants of Health   Financial Resource Strain: Not on file  Food Insecurity: No Food Insecurity   Worried About Running Out of Food in the Last Year: Never true   Ran Out of Food in the Last Year: Never true  Transportation Needs: No Transportation Needs   Lack of Transportation (Medical): No   Lack of Transportation (Non-Medical): No  Physical Activity: Not on file  Stress: Not on file  Social  Connections: Not on file    Family History: Family History  Problem Relation Age of Onset   Healthy Mother    Healthy Father     Allergies: Allergies  Allergen Reactions   Albolene Swelling and Rash   Lactose Diarrhea and Other (See Comments)    constipation     Medications Prior to Admission  Medication Sig Dispense Refill Last Dose   Magnesium Oxide (MAG-OXIDE) 200 MG TABS Take 2 tablets (400 mg total) by mouth at bedtime. If that amount causes loose stools in the am, switch to 200mg  daily at bedtime. (Patient not taking: Reported on 05/12/2021) 60 tablet 3    prenatal vitamin w/FE, FA (PRENATAL 1 + 1) 27-1 MG TABS tablet Take 1 tablet by mouth daily at 12 noon.        Review of Systems   All systems reviewed and negative except as stated in HPI  Blood pressure 119/85, pulse 89, temperature 98 F (36.7 C), temperature source Oral, resp. rate 17, height 5\' 1"  (1.549 m), weight 61 kg, last menstrual period 08/02/2020, SpO2 100 %. General appearance: alert Lungs: clear to auscultation bilaterally  Heart: regular rate and rhythm Abdomen: soft, non-tender; bowel sounds normal Extremities: Homans sign is negative, no sign of DVT Presentation: cephalic Fetal monitoringBaseline: 130 bpm, Variability: Good {> 6 bpm), Accelerations: Reactive, and Decelerations: Absent Uterine activity every ~3 mins     Prenatal labs: ABO, Rh: --/--/A POS (03/27 0127) Antibody: Comment, See Final Results (09/07 0936) Rubella: 7.70 (05/12 0942) RPR: Non Reactive (09/07 0832)  HBsAg: Negative (05/12 0942)  HIV: Non Reactive (09/07 0832)  GBS:  negative  2 hr Glucola negative Genetic screening  LR NIPS, neg horizon, neg AFP Anatomy US intracardiac echogenic focus of left ventricle   Prenatal Transfer Tool  Maternal Diabetes: No Genetic Screening: Normal Maternal Ultrasounds/Referrals: Isolated EIF (echogenic intracardiac focus) Fetal Ultrasounds or other Referrals:  None Maternal  Substance Abuse:  No Significant Maternal Medications:  None Significant Maternal Lab Results: Group B Strep negative  Results for orders placed or performed during the hospital encounter of 05/15/21 (from the past 24 hour(s))  POCT fern test   Collection Time: 05/15/21  5:55 PM  Result Value Ref Range   POCT Fern Test Positive = ruptured amniotic membanes     Patient Active Problem List   Diagnosis Date Noted   Yeast infection 03/31/2021   UTI (urinary tract infection) during pregnancy 02/02/2021   Supervision of low-risk pregnancy 10/08/2020   Adjustment disorder with mixed disturbance of emotions and conduct 04/20/2018    Assessment/Plan:  Michelle Stewart is a 20 y.o. G1P0000 at [redacted]w[redacted]d here for PROM (1600)   #Labor: here after PROM, holding in MAU while awaiting labor bed. Will plan to check cervix around 9-10PM and if unchanged from previous check yesterday (during separate MAU visit) overnight team to consider placing foley balloon while patient in MAU #FWB: Cat I #ID:  GBS neg #MOF: breast #MOC: depo #Circ:  N/A  Warner Mccreedy, MD  05/15/2021, 6:23 PM

## 2021-05-15 NOTE — MAU Provider Note (Signed)
Seen by provider at 0435     S: [name] is a 20 y.o. G1P0000 at [redacted]w[redacted]d  who presents to MAU today complaining contractions q 2-5 minutes She denies vaginal bleeding. She denies LOF. She reports normal fetal movement.    O: BP 110/69 (BP Location: Right Arm)   Pulse 100   Temp 97.6 F (36.4 C) (Oral)   Resp 18   Ht 5\' 1"  (1.549 m)   Wt 64 kg   LMP 08/02/2020   BMI 26.68 kg/m  GENERAL: Well-developed, well-nourished female in no acute distress.  HEAD: Normocephalic, atraumatic.  CHEST: Normal effort of breathing, regular heart rate ABDOMEN: Soft, nontender, gravid  Cervical exam:  Dilation: 1 Effacement (%): 80 Cervical Position: (P) Anterior Station: Ballotable Exam by:: Korde Jeppsen, CNM  Dilation: 1 Effacement (%): 80 Cervical Position: (P) Anterior Station: Ballotable Exam by:: Colon Rueth, CNM   Fetal Monitoring: FHT:  Baseline 130 , moderate variability, accelerations present, no decelerations Contractions:  Irregular    We rechecked her 2 hours later and cervix did not change.  Assessment: Single IUP @ [redacted]w[redacted]d False labor/prodromal labor Uterine contractions in pregnancy Not in Labor Reactive Fetal Heart Rate Tracing Reassuring FHR for Gestational Age  Plan: D/C home with labor precautions  Keep scheduled appt for IOL on next week    [redacted]w[redacted]d 05/15/2021 6:30 AM

## 2021-05-15 NOTE — MAU Note (Signed)
PT SAYS STRONG UC'S  SINCE 0200 PNC WITH CLINIC VE 1 CM- Tuesday DENIES HSV GBS- NEG

## 2021-05-15 NOTE — MAU Note (Addendum)
Just got out of the shower, had a big gush, than another.  (Around 1630) Clear fluid, still coming. No bleeding.  No pains.

## 2021-05-15 NOTE — Telephone Encounter (Signed)
Patient called and left message on nurse voicemail line stating she has questions about whether or not to go to the hospital.   Per chart review, patient went to hospital this morning for evaluation.

## 2021-05-16 ENCOUNTER — Inpatient Hospital Stay (HOSPITAL_COMMUNITY): Payer: Medicaid Other | Admitting: Anesthesiology

## 2021-05-16 ENCOUNTER — Encounter (HOSPITAL_COMMUNITY): Payer: Self-pay | Admitting: Obstetrics and Gynecology

## 2021-05-16 DIAGNOSIS — D649 Anemia, unspecified: Secondary | ICD-10-CM | POA: Diagnosis not present

## 2021-05-16 DIAGNOSIS — Z3A39 39 weeks gestation of pregnancy: Secondary | ICD-10-CM | POA: Diagnosis not present

## 2021-05-16 DIAGNOSIS — O9902 Anemia complicating childbirth: Secondary | ICD-10-CM | POA: Diagnosis not present

## 2021-05-16 DIAGNOSIS — Z3480 Encounter for supervision of other normal pregnancy, unspecified trimester: Secondary | ICD-10-CM

## 2021-05-16 DIAGNOSIS — O4202 Full-term premature rupture of membranes, onset of labor within 24 hours of rupture: Secondary | ICD-10-CM | POA: Diagnosis not present

## 2021-05-16 LAB — RPR: RPR Ser Ql: NONREACTIVE

## 2021-05-16 MED ORDER — ACETAMINOPHEN 325 MG PO TABS
650.0000 mg | ORAL_TABLET | ORAL | Status: DC | PRN
  Administered 2021-05-16: 650 mg via ORAL
  Filled 2021-05-16: qty 2

## 2021-05-16 MED ORDER — TERBUTALINE SULFATE 1 MG/ML IJ SOLN: 0.2500 mg | Freq: Once | INTRAMUSCULAR | Status: DC | PRN

## 2021-05-16 MED ORDER — ONDANSETRON HCL 4 MG PO TABS: 4.0000 mg | ORAL_TABLET | ORAL | Status: DC | PRN

## 2021-05-16 MED ORDER — DIBUCAINE (PERIANAL) 1 % EX OINT: 1.0000 "application " | TOPICAL_OINTMENT | CUTANEOUS | Status: DC | PRN

## 2021-05-16 MED ORDER — PRENATAL MULTIVITAMIN CH
1.0000 | ORAL_TABLET | Freq: Every day | ORAL | Status: DC
  Administered 2021-05-17 – 2021-05-18 (×2): 1 via ORAL
  Filled 2021-05-16 (×2): qty 1

## 2021-05-16 MED ORDER — LIDOCAINE HCL (PF) 1 % IJ SOLN
INTRAMUSCULAR | Status: DC | PRN
  Administered 2021-05-16: 8 mL via EPIDURAL

## 2021-05-16 MED ORDER — ACETAMINOPHEN 325 MG PO TABS: 650.0000 mg | ORAL_TABLET | ORAL | Status: DC | PRN

## 2021-05-16 MED ORDER — FENTANYL CITRATE (PF) 100 MCG/2ML IJ SOLN: 50.0000 ug | INTRAMUSCULAR | Status: DC | PRN

## 2021-05-16 MED ORDER — ONDANSETRON HCL 4 MG/2ML IJ SOLN: 4.0000 mg | INTRAMUSCULAR | Status: DC | PRN

## 2021-05-16 MED ORDER — BUTORPHANOL TARTRATE 1 MG/ML IJ SOLN
1.0000 mg | Freq: Once | INTRAMUSCULAR | Status: AC
  Administered 2021-05-16: 1 mg via INTRAVENOUS

## 2021-05-16 MED ORDER — OXYTOCIN-SODIUM CHLORIDE 30-0.9 UT/500ML-% IV SOLN
1.0000 m[IU]/min | INTRAVENOUS | Status: DC
  Administered 2021-05-16: 2 m[IU]/min via INTRAVENOUS

## 2021-05-16 MED ORDER — ONDANSETRON HCL 4 MG/2ML IJ SOLN
4.0000 mg | Freq: Four times a day (QID) | INTRAMUSCULAR | Status: DC | PRN
  Administered 2021-05-16: 4 mg via INTRAVENOUS
  Filled 2021-05-16: qty 2

## 2021-05-16 MED ORDER — MEDROXYPROGESTERONE ACETATE 150 MG/ML IM SUSP
150.0000 mg | INTRAMUSCULAR | Status: AC | PRN
  Administered 2021-05-18: 150 mg via INTRAMUSCULAR
  Filled 2021-05-16 (×2): qty 1

## 2021-05-16 MED ORDER — OXYTOCIN BOLUS FROM INFUSION
333.0000 mL | Freq: Once | INTRAVENOUS | Status: AC
  Administered 2021-05-16: 333 mL via INTRAVENOUS

## 2021-05-16 MED ORDER — MISOPROSTOL 50MCG HALF TABLET
50.0000 ug | ORAL_TABLET | ORAL | Status: DC | PRN
  Administered 2021-05-16 (×2): 50 ug via BUCCAL
  Filled 2021-05-16 (×2): qty 1

## 2021-05-16 MED ORDER — OXYCODONE-ACETAMINOPHEN 5-325 MG PO TABS: 2.0000 | ORAL_TABLET | ORAL | Status: DC | PRN

## 2021-05-16 MED ORDER — BENZOCAINE-MENTHOL 20-0.5 % EX AERO
1.0000 "application " | INHALATION_SPRAY | CUTANEOUS | Status: DC | PRN
  Filled 2021-05-16: qty 56

## 2021-05-16 MED ORDER — WITCH HAZEL-GLYCERIN EX PADS: 1.0000 "application " | MEDICATED_PAD | CUTANEOUS | Status: DC | PRN

## 2021-05-16 MED ORDER — LACTATED RINGERS IV SOLN: 500.0000 mL | INTRAVENOUS | Status: DC | PRN

## 2021-05-16 MED ORDER — SENNOSIDES-DOCUSATE SODIUM 8.6-50 MG PO TABS
2.0000 | ORAL_TABLET | Freq: Every day | ORAL | Status: DC
  Administered 2021-05-17 – 2021-05-18 (×2): 2 via ORAL
  Filled 2021-05-16 (×2): qty 2

## 2021-05-16 MED ORDER — SIMETHICONE 80 MG PO CHEW
80.0000 mg | CHEWABLE_TABLET | ORAL | Status: DC | PRN
  Filled 2021-05-16: qty 1

## 2021-05-16 MED ORDER — FENTANYL-BUPIVACAINE-NACL 0.5-0.125-0.9 MG/250ML-% EP SOLN
EPIDURAL | Status: AC
  Filled 2021-05-16: qty 250

## 2021-05-16 MED ORDER — SOD CITRATE-CITRIC ACID 500-334 MG/5ML PO SOLN: 30.0000 mL | ORAL | Status: DC | PRN

## 2021-05-16 MED ORDER — FENTANYL CITRATE (PF) 100 MCG/2ML IJ SOLN
100.0000 ug | Freq: Once | INTRAMUSCULAR | Status: AC
  Administered 2021-05-16: 100 ug via INTRAVENOUS
  Filled 2021-05-16: qty 2

## 2021-05-16 MED ORDER — COCONUT OIL OIL: 1.0000 "application " | TOPICAL_OIL | Status: DC | PRN

## 2021-05-16 MED ORDER — LIDOCAINE HCL (PF) 1 % IJ SOLN: 30.0000 mL | INTRAMUSCULAR | Status: DC | PRN

## 2021-05-16 MED ORDER — OXYTOCIN-SODIUM CHLORIDE 30-0.9 UT/500ML-% IV SOLN
2.5000 [IU]/h | INTRAVENOUS | Status: DC
  Filled 2021-05-16: qty 500

## 2021-05-16 MED ORDER — BUTORPHANOL TARTRATE 1 MG/ML IJ SOLN
INTRAMUSCULAR | Status: AC
  Filled 2021-05-16: qty 1

## 2021-05-16 MED ORDER — MEASLES, MUMPS & RUBELLA VAC IJ SOLR: 0.5000 mL | Freq: Once | INTRAMUSCULAR | Status: DC

## 2021-05-16 MED ORDER — DIPHENHYDRAMINE HCL 25 MG PO CAPS: 25.0000 mg | ORAL_CAPSULE | Freq: Four times a day (QID) | ORAL | Status: DC | PRN

## 2021-05-16 MED ORDER — TETANUS-DIPHTH-ACELL PERTUSSIS 5-2.5-18.5 LF-MCG/0.5 IM SUSY: 0.5000 mL | PREFILLED_SYRINGE | Freq: Once | INTRAMUSCULAR | Status: DC

## 2021-05-16 MED ORDER — FENTANYL-BUPIVACAINE-NACL 0.5-0.125-0.9 MG/250ML-% EP SOLN
EPIDURAL | Status: DC | PRN
  Administered 2021-05-16: 12 mL/h via EPIDURAL

## 2021-05-16 MED ORDER — IBUPROFEN 600 MG PO TABS
600.0000 mg | ORAL_TABLET | Freq: Four times a day (QID) | ORAL | Status: DC
  Administered 2021-05-16 – 2021-05-18 (×8): 600 mg via ORAL
  Filled 2021-05-16 (×8): qty 1

## 2021-05-16 MED ORDER — OXYCODONE-ACETAMINOPHEN 5-325 MG PO TABS: 1.0000 | ORAL_TABLET | ORAL | Status: DC | PRN

## 2021-05-16 NOTE — Lactation Note (Signed)
This note was copied from a baby's chart. Lactation Consultation Note  Patient Name: Michelle Stewart [id number] Date: 05/16/2021 Reason for consult: L&D Initial assessment;Term;Primapara;1st time breastfeeding Age:21 hours   Initial L&D Consult:  Visited with family < 1 hour after birth Mother had baby latched, however, baby was not positioned well and appeared to be falling off the breast.  Mother shaking and uncomfortable with positioning due to IV placement and blood pressure cuff.  Offered to place baby on the alternate breast with better support.  Observed her feeding for 5 minutes.  Will follow up on the M/B unit.     Maternal Data    Feeding Mother's Current Feeding Choice: Breast Milk  LATCH Score Latch: Repeated attempts needed to sustain latch, nipple held in mouth throughout feeding, stimulation needed to elicit sucking reflex.  Audible Swallowing: None  Type of Nipple: Everted at rest and after stimulation  Comfort (Breast/Nipple): Soft / non-tender  Hold (Positioning): Assistance needed to correctly position infant at breast and maintain latch.  LATCH Score: 6   Lactation Tools Discussed/Used    Interventions Interventions: Assisted with latch;Skin to skin  Discharge    Consult Status Consult Status: Follow-up from L&D    Michelle Stewart [name] 05/16/2021, 1:31 PM

## 2021-05-16 NOTE — Progress Notes (Signed)
Labor Progress Note Michelle Stewart is a 20 y.o. G1P0000 at [redacted]w[redacted]d who presented for PROM.  S: No concerns at this time. Reports feeling contractions here and there but they are not painful.   O:  BP (!) 109/59   Pulse 87   Temp 97.8 F (36.6 C) (Oral)   Resp 18   Ht 5\' 1"  (1.549 m)   Wt 61 kg   LMP 08/02/2020   SpO2 100%   BMI 25.39 kg/m   EFM: Baseline 110 bpm, moderate variability, + accels, no decels  Toco: Occasional contractions  CVE: Dilation: 2 Effacement (%): 60 Station: Ballotable Exam by:: [email or web address]   A&P: 20 y.o. G1P0000 [redacted]w[redacted]d   #Labor: Foley balloon placed this check. Mom and baby tolerated this well. Will give dose of buccal Cytotec and reassess in 4 hours.  #Pain: PRN; IV Fentanyl for now  #FWB: Cat 1  #GBS negative  [redacted]w[redacted]d, MD 3:07 AM

## 2021-05-16 NOTE — Anesthesia Procedure Notes (Signed)
Epidural Patient location during procedure: OB Start time: 05/16/2021 9:05 AM End time: 05/16/2021 9:15 AM  Staffing Anesthesiologist: Mellody Dance, MD Performed: anesthesiologist   Preanesthetic Checklist Completed: patient identified, IV checked, site marked, risks and benefits discussed, monitors and equipment checked, pre-op evaluation and timeout performed  Epidural Patient position: sitting Prep: DuraPrep Patient monitoring: heart rate, cardiac monitor, continuous pulse ox and blood pressure Approach: midline Location: L2-L3 Injection technique: LOR saline  Needle:  Needle type: Tuohy  Needle gauge: 17 G Needle length: 9 cm Needle insertion depth: 5.5 cm Catheter type: closed end flexible Catheter size: 20 Guage Catheter at skin depth: 10.5 cm Test dose: negative and Other  Assessment Events: blood not aspirated, injection not painful, no injection resistance and negative IV test  Additional Notes Informed consent obtained prior to proceeding including risk of failure, 1% risk of PDPH, risk of minor discomfort and bruising.  Discussed rare but serious complications including epidural abscess, permanent nerve injury, epidural hematoma.  Discussed alternatives to epidural analgesia and patient desires to proceed.  Timeout performed pre-procedure verifying patient name, procedure, and platelet count.  Patient tolerated procedure well.

## 2021-05-16 NOTE — Progress Notes (Signed)
Michelle Stewart is a 20 y.o. G1P0000 at [redacted]w[redacted]d admitted for PROM  Subjective: Feels better after epidural placed. Still feeling some pressure right after. No constant pressure  Balloon fell out Objective: BP (!) 92/53   Pulse 69   Temp 97.8 F (36.6 C) (Oral)   Resp 18   Ht 5\' 1"  (1.549 m)   Wt 61 kg   LMP 08/02/2020   SpO2 100%   BMI 25.39 kg/m  No intake/output data recorded. No intake/output data recorded.  FHT:  FHR: 125 bpm, variability: moderate,  accelerations:  Present,  decelerations:  Present in setting of low BP after epidural, getting IV fluid bolus and position changes. Appropriate variability and accels in between. UC:   regular, every 2-4 minutes SVE:   Dilation: 2 Effacement (%): 60 Station: Ballotable Exam by:: C. Albert  Labs: Lab Results  Component Value Date   WBC 14.6 (H) 05/15/2021   HGB 10.5 (L) 05/15/2021   HCT 32.7 (L) 05/15/2021   MCV 83.8 05/15/2021   PLT 225 05/15/2021    Assessment / Plan: G1P0000 at [redacted]w[redacted]d admitted for PROM at 1600 on 12/3. Now 18 hours ROM. Remains afebrile. S/p FB. Got epidural and in post epidural observation period.  Once settled out plan for check and likely start pitocin after check.   Fetal Wellbeing:  Category II, post epidural low maternal BP and now with some late decels. Appropriate variability in between and also accels.  Pain Control:  Epidural I/D:   GBS neg  14/3 05/16/2021, 9:56 AM

## 2021-05-16 NOTE — Discharge Summary (Signed)
Postpartum Discharge Summary  Date of Service updated-yes     Patient Name: Michelle Stewart DOB: 2001-05-10 MRN: [id number]  Date of admission: 05/15/2021 Delivery date:05/16/2021  Delivering provider: Renard Matter  Date of discharge: 05/18/2021  Admitting diagnosis: Supervision of other normal pregnancy, antepartum [Z34.80] Intrauterine pregnancy: [redacted]w[redacted]d    Secondary diagnosis:  Principal Problem:   Supervision of other normal pregnancy, antepartum Active Problems:   Adjustment disorder with mixed disturbance of emotions and conduct   Supervision of low-risk pregnancy   Vaginal delivery  Additional problems: None    Discharge diagnosis: Term Pregnancy Delivered                                              Post partum procedures: None Augmentation: Pitocin, Cytotec, and IP Foley Complications: None  Hospital course: Onset of Labor With Vaginal Delivery      20y.o. yo G1P1001 at 354w6das admitted for PROM and early labor on 05/15/2021. Patient had an uncomplicated labor course as follows:  Membrane Rupture Time/Date: 4:30 PM ,05/15/2021   Delivery Method:Vaginal, Spontaneous  Episiotomy: None  Lacerations:  Periurethral  Patient had an uncomplicated postpartum course.  She is ambulating, tolerating a regular diet, passing flatus, and urinating well. Patient is discharged home in stable condition on 05/18/21.  Newborn Data: Birth date:05/16/2021  Birth time:12:40 PM  Gender:Female  Living status:Living  Apgars:9 ,9  Weight:3280 g   Magnesium Sulfate received: No BMZ received: No Rhophylac:N/A MMR:N/A T-DaP:Given prenatally Flu: Given prenatally Transfusion:No  Physical exam  Vitals:   05/17/21 0550 05/17/21 1615 05/17/21 1945 05/18/21 0527  BP: 104/70 105/62 107/66 (!) 105/53  Pulse: 80 75 (!) 108 67  Resp: '18 16 18 16  ' Temp: 97.6 F (36.4 C) 98.6 F (37 C) 98.1 F (36.7 C)   TempSrc: Oral Oral Oral   SpO2:      Weight:      Height:       General:  alert, cooperative, and no distress Lochia: appropriate Uterine Fundus: firm Incision: N/A DVT Evaluation: No evidence of DVT seen on physical exam. Negative Homan's sign. No cords or calf tenderness. No significant calf/ankle edema. Labs: Lab Results  Component Value Date   WBC 14.6 (H) 05/15/2021   HGB 10.5 (L) 05/15/2021   HCT 32.7 (L) 05/15/2021   MCV 83.8 05/15/2021   PLT 225 05/15/2021   CMP Latest Ref Rng & Units 09/07/2020  Glucose 70 - 99 mg/dL 86  BUN 6 - 20 mg/dL 16  Creatinine 0.44 - 1.00 mg/dL 0.75  Sodium 135 - 145 mmol/L 135  Potassium 3.5 - 5.1 mmol/L 3.8  Chloride 98 - 111 mmol/L 105  CO2 22 - 32 mmol/L 26  Calcium 8.9 - 10.3 mg/dL 9.3  Total Protein 6.5 - 8.1 g/dL 6.9  Total Bilirubin 0.3 - 1.2 mg/dL 0.8  Alkaline Phos 38 - 126 U/L 54  AST 15 - 41 U/L 22  ALT 0 - 44 U/L 17   Edinburgh Score: Edinburgh Postnatal Depression Scale Screening Tool 05/16/2021  I have been able to laugh and see the funny side of things. 0  I have looked forward with enjoyment to things. 0  I have blamed myself unnecessarily when things went wrong. 0  I have been anxious or worried for no good reason. 0  I have felt scared or panicky  for no good reason. 0  Things have been getting on top of me. 0  I have been so unhappy that I have had difficulty sleeping. 1  I have felt sad or miserable. 0  I have been so unhappy that I have been crying. 0  The thought of harming myself has occurred to me. 0  Edinburgh Postnatal Depression Scale Total 1     After visit meds:  Allergies as of 05/18/2021       Reactions   Albolene Swelling, Rash   Lactose Diarrhea, Other (See Comments)   constipation        Medication List     STOP taking these medications    Magnesium Oxide 200 MG Tabs Commonly known as: Mag-Oxide       TAKE these medications    ibuprofen 600 MG tablet Commonly known as: ADVIL Take 1 tablet (600 mg total) by mouth every 6 (six) hours as needed for  cramping or mild pain.   prenatal vitamin w/FE, FA 27-1 MG Tabs tablet Take 1 tablet by mouth daily at 12 noon.         Discharge home in stable condition Infant Feeding: Bottle and Breast Infant Disposition:home with mother Discharge instruction: per After Visit Summary and Postpartum booklet. Activity: Advance as tolerated. Pelvic rest for 6 weeks.  Diet: routine diet Future Appointments:No future appointments. Follow up Visit:  [place] [place] [facility] Follow up in 6 week(s).   Specialty: Obstetrics and Gynecology Contact information: [place] [id number] [phone]               Message sent to [facility] by Dr. Cy Blamer on 05/16/2021  Please schedule this patient for a In person postpartum visit in 6 weeks with the following provider: Any provider. Additional Postpartum F/U: None   Low risk pregnancy complicated by:  None Delivery mode:  Vaginal, Spontaneous  Anticipated Birth Control:   Plans depo   05/18/2021 Julianne Handler, CNM

## 2021-05-16 NOTE — Lactation Note (Signed)
This note was copied from a baby's chart. Lactation Consultation Note  Patient Name: Michelle Stewart [id number] Date: 05/16/2021 Reason for consult: Initial assessment;Term;1st time breastfeeding Age:21 hours LC entered the room mom had latched infant for 30 minutes, mom want LC observe latch, infant was still cuing, infant has very strong suck that is loud but not clicking noise with latch. Infant was latched with depth and LC discussed with mom keep infant's top lip flanged outward, LC observed thick and tight upper lip frenulum and dad has gap in top front teeth. Per mom, no pain with latch and mom broke latch and mom's nipples were rounded and not pinched. LC ask mom to have Pediatrician MD to assess infant's oral cavity questionable restricted range of motion the  bottom of tongue, short, tight lingual frenulum, infant did not extend tongue past gumline. Mom shown how to use hand pump  & how to disassemble, clean, & reassemble parts.  Mom made aware of O/P services, breastfeeding support groups, community resources, and our phone # for post-discharge questions.   Mom's feeding plan:  1-Mom knows to continue to breastfeed infant according to feeding cues, 8 to 12+ times within 24 hours, skin to skin. 2-Pre-pump breast with hand pump prior to latching infant at the breast to evert nipple shaft out more ( short shafted nipple). 3-Ask RN/LC for latch assistance if needed. Maternal Data Has patient been taught Hand Expression?: Yes Does the patient have breastfeeding experience prior to this delivery?: No  Feeding Mother's Current Feeding Choice: Breast Milk  LATCH Score Latch: Grasps breast easily, tongue down, lips flanged, rhythmical sucking. (Infant has very hard suck)  Audible Swallowing: Spontaneous and intermittent  Type of Nipple: Everted at rest and after stimulation  Comfort (Breast/Nipple): Soft / non-tender  Hold (Positioning): Assistance needed to correctly position  infant at breast and maintain latch.  LATCH Score: 9   Lactation Tools Discussed/Used Tools: Pump Breast pump type: Manual Pump Education: Setup, frequency, and cleaning;Milk Storage Reason for Pumping: mom is short shafted given hand pump by RN to pre-pump breast prior to latching infant. Pumping frequency: Pre-pump breast prior to latching infant.  Interventions Interventions: Breast feeding basics reviewed;Assisted with latch;Skin to skin;Pre-pump if needed;Breast compression;Adjust position;Support pillows;Position options;Expressed milk;Education;LC Services brochure  Discharge Pump: Personal;Manual (Per mom, she has Medela DEBP at home.) [facility] Program: Yes  Consult Status Consult Status: Follow-up Date: 05/17/21 Follow-up type: In-patient    Danelle Earthly 05/16/2021, 11:39 PM

## 2021-05-16 NOTE — Anesthesia Preprocedure Evaluation (Signed)
Anesthesia Evaluation  Patient identified by MRN, date of birth, ID band Patient awake    Reviewed: Allergy & Precautions, H&P , NPO status , Patient's Chart, lab work & pertinent test results  Airway Mallampati: II  TM Distance: >3 FB Neck ROM: Full    Dental no notable dental hx.    Pulmonary neg pulmonary ROS,    Pulmonary exam normal breath sounds clear to auscultation       Cardiovascular Exercise Tolerance: Good negative cardio ROS Normal cardiovascular exam Rhythm:Regular Rate:Normal     Neuro/Psych negative neurological ROS  negative psych ROS   GI/Hepatic negative GI ROS, Neg liver ROS,   Endo/Other  negative endocrine ROS  Renal/GU negative Renal ROS  negative genitourinary   Musculoskeletal negative musculoskeletal ROS (+)   Abdominal Normal abdominal exam  (+)   Peds negative pediatric ROS (+)  Hematology  (+) anemia ,   Anesthesia Other Findings   Reproductive/Obstetrics (+) Pregnancy                             Anesthesia Physical Anesthesia Plan  ASA: 2  Anesthesia Plan: Epidural   Post-op Pain Management:    Induction:   PONV Risk Score and Plan: 2 and Treatment may vary due to age or medical condition  Airway Management Planned: Natural Airway  Additional Equipment: None  Intra-op Plan:   Post-operative Plan:   Informed Consent: I have reviewed the patients History and Physical, chart, labs and discussed the procedure including the risks, benefits and alternatives for the proposed anesthesia with the patient or authorized representative who has indicated his/her understanding and acceptance.       Plan Discussed with: Anesthesiologist  Anesthesia Plan Comments:         Anesthesia Quick Evaluation

## 2021-05-16 NOTE — Anesthesia Postprocedure Evaluation (Signed)
Anesthesia Post Note  Patient: Michelle Stewart  Procedure(s) Performed: AN AD HOC LABOR EPIDURAL     Anesthesia Post Evaluation No notable events documented.  Last Vitals:  Vitals:   05/16/21 1450 05/16/21 1550  BP: 107/81 (!) 109/56  Pulse: 85 78  Resp: 16 16  Temp: 37.3 C 36.9 C  SpO2: 100%     Last Pain:  Vitals:   05/16/21 1825  TempSrc:   PainSc: 3    Pain Goal: Patients Stated Pain Goal: 3 (05/16/21 1825)                 [name]

## 2021-05-17 NOTE — Lactation Note (Signed)
This note was copied from a baby's chart. Lactation Consultation Note  Patient Name: Michelle Stewart [id number] Date: 05/17/2021 Reason for consult: Follow-up assessment;Mother's request;Term;Breastfeeding assistance;Hyperbilirubinemia Age:21 hours  Infant bilirubin elevated.  LC went in to ask Mom how feeding going so far.  Mom had a recent feeding infant resting at the time of the visit.  Mom to call for latch assistance with next feeding. Mom to compress while infant at breast to offer more volume. Mom post pumping with dEBP, getting small amounts. Mom encouraged to continue use DEBP q 3hrs for 15 min after latching.   Maternal Data    Feeding Mother's Current Feeding Choice: Breast Milk  LATCH Score                    Lactation Tools Discussed/Used Tools: Pump;Flanges Breast pump type: Double-Electric Breast Pump Pump Education: Setup, frequency, and cleaning;Milk Storage Reason for Pumping: increase stimulation Pumping frequency: every 3 hrs for 15 min  Interventions Interventions: Breast feeding basics reviewed;Adjust position;DEBP;Assisted with latch;Support pillows;Skin to skin;Position options;Education;Breast Mining engineer;Infant Driven Feeding Algorithm education;Breast compression;Expressed milk  Discharge    Consult Status Consult Status: Follow-up Date: 05/18/21 Follow-up type: In-patient    Rod Majerus  Nicholson-Springer 05/17/2021, 7:21 PM

## 2021-05-17 NOTE — Progress Notes (Signed)
Post Partum Day 1 Subjective: no complaints, up ad lib, voiding, and tolerating PO  Objective: Blood pressure 105/62, pulse 75, temperature 98.6 F (37 C), temperature source Oral, resp. rate 16, height 5\' 1"  (1.549 m), weight 61 kg, last menstrual period 08/02/2020, SpO2 98 %, unknown if currently breastfeeding.  Physical Exam:  General: alert, cooperative, and no distress Lochia: appropriate Uterine Fundus: firm Incision: NA DVT Evaluation: No evidence of DVT seen on physical exam.  Recent Labs    05/15/21 2112  HGB 10.5*  HCT 32.7*    Assessment/Plan: Plan for discharge tomorrow, Breastfeeding, Lactation consult, and Contraception Depo   LOS: 2 days   2113 05/17/2021, 5:28 PM

## 2021-05-18 MED ORDER — IBUPROFEN 600 MG PO TABS: 600.0000 mg | ORAL_TABLET | Freq: Four times a day (QID) | ORAL | 0 refills | Status: DC | PRN

## 2021-05-18 NOTE — Lactation Note (Signed)
This note was copied from a baby's chart. Lactation Consultation Note  Patient Name: Michelle Stewart [id number] Date: 05/18/2021 Reason for consult: Follow-up assessment;1st time breastfeeding;Primapara;Term;Infant weight loss;Other (Comment) Age:21 hours As LC entered the room mom working on latching the baby.  LC noted the baby didn't have a good seal/ and LC added pillow support.  Worked on depth with mom and ease chin down and the depth improved .  Swallows noted . Latch score 8  LC reviewed breast feeding basics  and BF D/C teaching.  Per mom has a hand pump and a DEBP at home.  Mom aware of the [facility] resources after D/C.    Maternal Data    Feeding Mother's Current Feeding Choice: Breast Milk and Formula  LATCH Score Latch: Repeated attempts needed to sustain latch, nipple held in mouth throughout feeding, stimulation needed to elicit sucking reflex.  Audible Swallowing: Spontaneous and intermittent  Type of Nipple: Everted at rest and after stimulation  Comfort (Breast/Nipple): Soft / non-tender  Hold (Positioning): Assistance needed to correctly position infant at breast and maintain latch.  LATCH Score: 8   Lactation Tools Discussed/Used    Interventions Interventions: Breast feeding basics reviewed;Assisted with latch;Skin to skin;Breast compression;Adjust position;Education;LC Services brochure  Discharge Discharge Education: Engorgement and breast care;Warning signs for feeding baby Pump: Personal;Manual;DEBP WIC Program: Yes  Consult Status Consult Status: Complete Date: 05/18/21    Michelle Stewart 05/18/2021, 11:55 AM

## 2021-05-19 ENCOUNTER — Encounter: Payer: Medicaid Other | Admitting: Advanced Practice Midwife

## 2021-05-19 ENCOUNTER — Inpatient Hospital Stay (HOSPITAL_COMMUNITY)
Admission: AD | Admit: 2021-05-19 | Payer: Medicaid Other | Source: Home / Self Care | Admitting: Obstetrics & Gynecology

## 2021-05-19 ENCOUNTER — Inpatient Hospital Stay (HOSPITAL_COMMUNITY): Payer: Medicaid Other

## 2021-05-21 ENCOUNTER — Telehealth: Payer: Self-pay | Admitting: Family Medicine

## 2021-05-21 ENCOUNTER — Inpatient Hospital Stay (HOSPITAL_COMMUNITY)
Admission: AD | Admit: 2021-05-21 | Discharge: 2021-05-21 | Disposition: A | Discharge status: Home / Self Care | Payer: Medicaid Other | Attending: Family Medicine | Admitting: Family Medicine

## 2021-05-21 ENCOUNTER — Other Ambulatory Visit: Payer: Self-pay

## 2021-05-21 ENCOUNTER — Telehealth: Payer: Self-pay | Admitting: Certified Nurse Midwife

## 2021-05-21 ENCOUNTER — Inpatient Hospital Stay (HOSPITAL_COMMUNITY): Payer: Medicaid Other

## 2021-05-21 ENCOUNTER — Encounter (HOSPITAL_COMMUNITY): Payer: Self-pay | Admitting: Family Medicine

## 2021-05-21 DIAGNOSIS — O165 Unspecified maternal hypertension, complicating the puerperium: Secondary | ICD-10-CM | POA: Diagnosis not present

## 2021-05-21 DIAGNOSIS — R079 Chest pain, unspecified: Secondary | ICD-10-CM | POA: Diagnosis not present

## 2021-05-21 DIAGNOSIS — D649 Anemia, unspecified: Secondary | ICD-10-CM | POA: Diagnosis present

## 2021-05-21 DIAGNOSIS — D508 Other iron deficiency anemias: Secondary | ICD-10-CM | POA: Diagnosis not present

## 2021-05-21 DIAGNOSIS — R0602 Shortness of breath: Secondary | ICD-10-CM | POA: Insufficient documentation

## 2021-05-21 DIAGNOSIS — O9081 Anemia of the puerperium: Secondary | ICD-10-CM | POA: Diagnosis not present

## 2021-05-21 DIAGNOSIS — O99893 Other specified diseases and conditions complicating puerperium: Secondary | ICD-10-CM | POA: Diagnosis not present

## 2021-05-21 LAB — CBC
HCT: 30.2 % — ABNORMAL LOW (ref 36.0–46.0)
Hemoglobin: 9.3 g/dL — ABNORMAL LOW (ref 12.0–15.0)
MCH: 25.5 pg — ABNORMAL LOW (ref 26.0–34.0)
MCHC: 30.8 g/dL (ref 30.0–36.0)
MCV: 82.7 fL (ref 80.0–100.0)
Platelets: 239 10*3/uL (ref 150–400)
RBC: 3.65 MIL/uL — ABNORMAL LOW (ref 3.87–5.11)
RDW: 14.6 % (ref 11.5–15.5)
WBC: 10 10*3/uL (ref 4.0–10.5)
nRBC: 0 % (ref 0.0–0.2)

## 2021-05-21 LAB — COMPREHENSIVE METABOLIC PANEL
ALT: 25 U/L (ref 0–44)
AST: 28 U/L (ref 15–41)
Albumin: 2.7 g/dL — ABNORMAL LOW (ref 3.5–5.0)
Alkaline Phosphatase: 130 U/L — ABNORMAL HIGH (ref 38–126)
Anion gap: 7 (ref 5–15)
BUN: 11 mg/dL (ref 6–20)
CO2: 20 mmol/L — ABNORMAL LOW (ref 22–32)
Calcium: 8.7 mg/dL — ABNORMAL LOW (ref 8.9–10.3)
Chloride: 112 mmol/L — ABNORMAL HIGH (ref 98–111)
Creatinine, Ser: 0.62 mg/dL (ref 0.44–1.00)
GFR, Estimated: >60 mL/min (ref >60)
Glucose, Bld: 84 mg/dL (ref 70–99)
Potassium: 3.6 mmol/L (ref 3.5–5.1)
Sodium: 139 mmol/L (ref 135–145)
Total Bilirubin: 0.5 mg/dL (ref 0.3–1.2)
Total Protein: 5.6 g/dL — ABNORMAL LOW (ref 6.5–8.1)

## 2021-05-21 MED ORDER — NIFEDIPINE ER OSMOTIC RELEASE 30 MG PO TB24
30.0000 mg | ORAL_TABLET | Freq: Every day | ORAL | Status: DC
  Administered 2021-05-21: 30 mg via ORAL
  Filled 2021-05-21: qty 1

## 2021-05-21 MED ORDER — NIFEDIPINE ER 30 MG PO TB24: 30.0000 mg | ORAL_TABLET | Freq: Every day | ORAL | 1 refills | Status: DC

## 2021-05-21 NOTE — Telephone Encounter (Signed)
Patient just delivered and state she has been having shortness of breath and chest pains.

## 2021-05-21 NOTE — MAU Note (Signed)
PP 05/16/21 vag delivery. Pt c/o feeling SOB and having intermittent chest pain since she was discharged on Monday.  Feels a heavy ness in her chest that comes and goes and feels SOB especially when she bends down to pick baby up.

## 2021-05-21 NOTE — Telephone Encounter (Addendum)
Pt presented to front desk of [facility] at 12:15pm reporting increased SOB both at rest, when she walks around and whenever she tries to carry around the baby. Also brings on chest pains with any amount of exertion. Pt standing and sounds mildly SOB with shallower breathing, but warm and well perfused, also stable and able to talk (so no need for EMS transfer). Advised to go to MAU immediately for evaluation. FOB in car just outside the office, she agreed to have him drive her over.   Edd Arbour, CNM, MSN, IBCLC Certified Nurse Midwife, [facility]

## 2021-05-21 NOTE — MAU Provider Note (Signed)
Patient Michelle Stewart is a 20 y.o. G1P1001  At 5 days s/p NSVD on 05/16/2021 here with complaints of chest pain when she bends over and overall feeling like she can't catch her breath. Her bleeding is light; she reports that eating, drinking, using the bathroom. Her baby girl is healthy. She denies fever, HA, NV, constipation. She denies any complications during her delivery. She denies any history of high blood pressure or gestational diabetes.      History     CSN: [id number]  Arrival date and time: 05/21/21 1255   Event Date/Time   First Provider Initiated Contact with Patient 05/21/21 1336      Chief Complaint  Patient presents with   Chest Pain   Shortness of Breath   Chest Pain  This is a new problem. The current episode started in the past 7 days (it started3 days ago when she got home from the hospital). The problem occurs 2 to 4 times per day. The pain is at a severity of 7/10. Associated symptoms include shortness of breath. Pertinent negatives include no fever, headaches, nausea, numbness, syncope, vomiting or weakness.  Shortness of Breath This is a new problem. The current episode started in the past 7 days. Associated symptoms include chest pain. Pertinent negatives include no fever, headaches, syncope, vomiting or wheezing.   OB History     Gravida  1   Para  1   Term  1   Preterm  0   AB  0   Living  1      SAB  0   IAB  0   Ectopic  0   Multiple  0   Live Births  1           No past medical history on file.  Past Surgical History:  Procedure Laterality Date   MANDIBLE SURGERY      Family History  Problem Relation Age of Onset   Healthy Mother    Healthy Father     Social History   Tobacco Use   Smoking status: Never   Smokeless tobacco: Never  Vaping Use   Vaping Use: Never used  Substance Use Topics   Alcohol use: Never   Drug use: Never    Allergies:  Allergies  Allergen Reactions   Albolene Swelling and Rash    Lactose Diarrhea and Other (See Comments)    constipation     No medications prior to admission.    Review of Systems  Constitutional:  Negative for fever.  Respiratory:  Positive for shortness of breath. Negative for wheezing.   Cardiovascular:  Positive for chest pain. Negative for syncope.  Gastrointestinal:  Negative for nausea and vomiting.  Neurological:  Negative for weakness, numbness and headaches.  Physical Exam   Blood pressure (!) 135/98, pulse 62, temperature 98.2 F (36.8 C), resp. rate 18, unknown if currently breastfeeding.  Physical Exam Constitutional:      Appearance: She is well-developed.  HENT:     Head: Normocephalic.  Pulmonary:     Effort: Pulmonary effort is normal.     Breath sounds: Normal breath sounds. No decreased breath sounds or wheezing.  Neurological:     Mental Status: She is alert.    MAU Course  Procedures  MDM When she reclines she feels like she can't breathe, but she does not have chest pain.  Will draw pre-e labs> pre-e labs are normal Discussed with Dr. Crissie Reese, will do chest xray and EKG  Chest  x ray normal, EKG normal (reviewed by [facility])  Patient had 30 mg of procardia XL given while in MAU . Patient had two severe range pressures after talking in MAU; however, did not repeat and patient feels well. Dr. Crissie Reese is aware of pressures and comfortable with discharge.   Patient Vitals for the past 24 hrs:  BP Temp Pulse Resp  05/21/21 1732 -- -- -- 18  05/21/21 1701 (!) 135/98 -- 62 --  05/21/21 1648 (!) 146/95 -- 61 --  05/21/21 1632 (!) 152/100 -- 63 --  05/21/21 1616 (!) 158/108 -- 64 --  05/21/21 1601 (!) 169/97 -- 63 --  05/21/21 1546 (!) 162/100 -- 66 --  05/21/21 1531 (!) 154/104 -- 63 --  05/21/21 1516 (!) 157/102 -- 60 --  05/21/21 1501 (!) 152/91 -- 65 --  05/21/21 1446 132/84 -- 62 --  05/21/21 1431 (!) 149/100 -- 67 --  05/21/21 1416 (!) 148/99 -- 64 --  05/21/21 1401 (!) 149/101 -- 64 --  05/21/21 1346  (!) 142/104 -- 61 --  05/21/21 1331 (!) 141/97 -- 67 --  05/21/21 1329 (!) 146/96 -- 69 --  05/21/21 1312 (!) 143/87 98.2 F (36.8 C) 65 18    Assessment and Plan   1. Postpartum hypertension   2. Other iron deficiency anemia   -weakness and SOB most likely due to anemia; will recommend iron pills, eat iron rich foods -patient is stable for discharge; she is well-appearing with no symptoms.  -strict pre-e precautions reviewed; reviewed that patient should come back if any blurry vision, floating spots, HA that does not go away, worsening chest pain, RUQ pain. Encouraged her to rest, eat and hydrate this evening.  -patient will keep appt tomorrow for BP check; she will pick up her RX for procardia as well. Explained importance of starting that medicine and that if BP is not well controlled or she develops other concerning features of pre-e, she will most likely need eadmission to hospital for MagSo4 and observation. Patient understands and agrees with plan of care.   Luna Kitchens    Charlesetta Garibaldi Memory Heinrichs 05/22/2021, 6:11 AM

## 2021-05-21 NOTE — Telephone Encounter (Signed)
Walker, CNM to front office to speak with patient.

## 2021-05-22 ENCOUNTER — Other Ambulatory Visit: Payer: Self-pay | Admitting: Student

## 2021-05-22 ENCOUNTER — Telehealth: Payer: Self-pay | Admitting: *Deleted

## 2021-05-22 ENCOUNTER — Ambulatory Visit: Payer: Medicaid Other

## 2021-05-22 DIAGNOSIS — D649 Anemia, unspecified: Secondary | ICD-10-CM | POA: Diagnosis present

## 2021-05-22 MED ORDER — FERROUS FUMARATE 325 (106 FE) MG PO TABS: 1.0000 | ORAL_TABLET | ORAL | 0 refills | Status: AC

## 2021-05-22 NOTE — Telephone Encounter (Signed)
Maisyn had MAU visit 05/21/21 and nurse visit for bp check scheduled for today which she changed to Monday. Per provider request Luna Kitchens, CNM) I called Michelle Stewart with plans to see how she is doing , if she is having any symptoms of preeclampsia and to tell her about her low hemoglobin and to start iron. I reached her voice mail and left a message I was calling to see how she is doing since her hospital visit and that we see she is coming Monday. I asked her to call us if needed. Loreen Bankson,RN

## 2021-05-24 NOTE — Progress Notes (Signed)
Patient was assessed and managed by nursing staff during this encounter. I have reviewed the chart and agree with the documentation and plan. I have also made any necessary editorial changes.  Jayant Kriz, MD 05/24/2021 9:12 PM   

## 2021-05-25 ENCOUNTER — Other Ambulatory Visit: Payer: Self-pay

## 2021-05-25 ENCOUNTER — Ambulatory Visit (INDEPENDENT_AMBULATORY_CARE_PROVIDER_SITE_OTHER): Payer: Medicaid Other

## 2021-05-25 ENCOUNTER — Ambulatory Visit: Payer: Medicaid Other

## 2021-05-25 VITALS — BP 129/92 | HR 62 | Wt 124.0 lb

## 2021-05-25 DIAGNOSIS — Z0130 Encounter for examination of blood pressure without abnormal findings: Secondary | ICD-10-CM

## 2021-05-25 DIAGNOSIS — O165 Unspecified maternal hypertension, complicating the puerperium: Secondary | ICD-10-CM

## 2021-05-25 MED ORDER — NIFEDIPINE ER OSMOTIC RELEASE 60 MG PO TB24: 60.0000 mg | ORAL_TABLET | Freq: Every day | ORAL | 0 refills | Status: DC

## 2021-05-25 NOTE — Progress Notes (Signed)
Blood Pressure Check Visit  Michelle Stewart is here for blood pressure check following spontaneous vaginal delivery on 05/16/21 and visit to MAU on 05/22/21; elevated BP during MAU visit, negative pre-e labs, discharged on Nifedipine 30 mg daily. BP today is 141/90, repeat 129/92. Patient denies any symptoms of elevated BP today. Reports intermittent headache following delivery. Reviewed with Alvester Morin, MD, who recommends patient increase nifedipine to 60 mg daily and return for BP check with RN in 1 week. New prescription sent to pharmacy and patient notified of provider recommendation.  Marjo Bicker, RN 05/25/2021  1:41 PM

## 2021-05-26 ENCOUNTER — Encounter: Payer: Self-pay | Admitting: Radiology

## 2021-05-28 NOTE — Progress Notes (Signed)
Attestation of Attending Supervision of clinical support staff: I agree with the care provided to this patient and was available for any consultation.  I have reviewed the RN's note and chart. I was available for consult and to see the patient if needed.   Yoltzin Barg MD MPH Attending Physician Faculty Practice- [facility]  

## 2021-05-29 ENCOUNTER — Telehealth (HOSPITAL_COMMUNITY): Payer: Self-pay | Admitting: *Deleted

## 2021-05-29 NOTE — Telephone Encounter (Signed)
Mom reports feeling good. Has an appt on Monday with OB to discuss BP's. No concerns about herself at this time. EPDS=2 at PPOV on 12/12 [facility] score=1) Mom reports baby is doing well. Feeding, peeing, and pooping without difficulty. Safe sleep reviewed. Mom reports no concerns about baby at present.  Duffy Rhody, RN 05-29-2021 at 3:50pm

## 2021-06-01 ENCOUNTER — Ambulatory Visit: Payer: Medicaid Other

## 2021-06-02 ENCOUNTER — Ambulatory Visit: Payer: Medicaid Other

## 2021-06-12 ENCOUNTER — Other Ambulatory Visit: Payer: Self-pay

## 2021-06-12 ENCOUNTER — Telehealth: Payer: Self-pay | Admitting: Lactation Services

## 2021-06-12 ENCOUNTER — Ambulatory Visit (INDEPENDENT_AMBULATORY_CARE_PROVIDER_SITE_OTHER): Payer: Medicaid Other

## 2021-06-12 VITALS — BP 106/67 | HR 86 | Wt 116.2 lb

## 2021-06-12 DIAGNOSIS — O165 Unspecified maternal hypertension, complicating the puerperium: Secondary | ICD-10-CM

## 2021-06-12 DIAGNOSIS — Z658 Other specified problems related to psychosocial circumstances: Secondary | ICD-10-CM

## 2021-06-12 DIAGNOSIS — Z0130 Encounter for examination of blood pressure without abnormal findings: Secondary | ICD-10-CM

## 2021-06-12 MED ORDER — LABETALOL HCL 300 MG PO TABS: 300.0000 mg | ORAL_TABLET | Freq: Two times a day (BID) | ORAL | 0 refills | Status: DC

## 2021-06-12 NOTE — Progress Notes (Signed)
Blood Pressure Check Visit  Michelle Stewart is here for blood pressure check following vaginal delivery on 05/16/21. Patient reports headache and blurry vision with AM at 5. BP at that time was 162/100. Patient took Nifedipine after checking BP. Headache now is 5/10. Denies blurry vision now.  BP today is 106/67. Reviewed with Vergie Living, MD who recommends patient discontinue Nifedipine and begin Labetalol 300 mg BID starting tonight. Gives verbal order for CBC and CMP to be drawn today. Patient scheduled for virtual BP check in 1 week.  Pt reports some difficulty with breastfeeding. Pt reports overwhelm due to frequency of feedings. Patient does not have anyone at home during the day for support and has returned to work for social interaction. Pt reports difficulty interacting with baby because they cannot respond to her. Recommended pt schedule an appt with both Lactation Consultant and [facility] provider for follow up.   Marjo Bicker, RN 06/12/2021  9:50 AM

## 2021-06-12 NOTE — BH Specialist Note (Deleted)
Integrated Behavioral Health via Telemedicine Visit  06/12/2021 Michelle Stewart [id number]  Number of Integrated Behavioral Health visits: 1 Session Start time: 2:45***  Session End time: 3:45*** Total time: {IBH Total Time:21014050}  Referring Provider: *** Patient/Family location: Home*** [facility] Provider location: [facility]  All persons participating in visit: Patient *** and Michelle Stewart ***  Types of Service: {CHL AMB TYPE OF [phone]}  I connected with Michelle Stewart and/or Michelle Stewart {family members:20773} via  Telephone or Engineer, civil (consulting)  (Video is Surveyor, mining) and verified that I am speaking with the correct person using two identifiers. Discussed confidentiality: {YES/NO:21197}  I discussed the limitations of telemedicine and the availability of in person appointments.  Discussed there is a possibility of technology failure and discussed alternative modes of communication if that failure occurs.  I discussed that engaging in this telemedicine visit, they consent to the provision of behavioral healthcare and the services will be billed under their insurance.  Patient and/or legal guardian expressed understanding and consented to Telemedicine visit: {YES/NO:21197}  Presenting Concerns: Patient and/or family reports the following symptoms/concerns: *** Duration of problem: ***; Severity of problem: {Mild/Moderate/Severe:20260}  Patient and/or Family's Strengths/Protective Factors: {CHL AMB BH PROTECTIVE [phone]}  Goals Addressed: Patient will:  Reduce symptoms of: {IBH Symptoms:21014056}   Increase knowledge and/or ability of: {IBH Patient Tools:21014057}   Demonstrate ability to: {IBH Goals:21014053}  Progress towards Goals: {CHL AMB BH PROGRESS TOWARDS [phone]}  Interventions: Interventions utilized:  {IBH  Interventions:21014054} Standardized Assessments completed: {IBH Screening Tools:21014051}  Patient and/or Family Response: ***  Assessment: Patient currently experiencing ***.   Patient may benefit from ***.  Plan: Follow up with behavioral health clinician on : *** Behavioral recommendations: *** Referral(s): {IBH Referrals:21014055}  I discussed the assessment and treatment plan with the patient and/or parent/guardian. They were provided an opportunity to ask questions and all were answered. They agreed with the plan and demonstrated an understanding of the instructions.   They were advised to call back or seek an in-person evaluation if the symptoms worsen or if the condition fails to improve as anticipated.  Rae Lips, LCSW  Depression screen [facility] 2/9 05/12/2021 04/29/2021 04/22/2021 04/07/2021 03/05/2021  Decreased Interest 0 0 0 0 0  Down, Depressed, Hopeless 0 0 0 0 0  PHQ - 2 Score 0 0 0 0 0  Altered sleeping 1 1 2 2 1   Tired, decreased energy 1 1 2 2 1   Change in appetite 0 0 0 0 0  Feeling bad or failure about yourself  0 0 0 0 0  Trouble concentrating 0 0 0 0 0  Moving slowly or fidgety/restless 0 0 0 0 0  Suicidal thoughts 0 0 0 0 0  PHQ-9 Score 2 2 4 4 2    GAD 7 : Generalized Anxiety Score 05/12/2021 04/29/2021 04/22/2021 04/07/2021  Nervous, Anxious, on Edge 0 0 0 0  Control/stop worrying 0 0 0 0  Worry too much - different things 0 0 0 0  Trouble relaxing 1 0 1 0  Restless 0 0 0 0  Easily annoyed or irritable 0 0 0 0  Afraid - awful might happen 0 0 0 0  Total GAD 7 Score 1 0 1 0   *** Edinburgh Postnatal Depression Scale Screening Tool 06/12/2021 05/25/2021 05/16/2021  I have been able to laugh and see the funny side of things. 0 0 0  I have looked forward with enjoyment to  things. 0 0 0  I have blamed myself unnecessarily when things went wrong. 0 0 0  I have been anxious or worried for no good reason. 0 0 0  I have felt scared or panicky for no  good reason. 0 0 0  Things have been getting on top of me. 0 0 0  I have been so unhappy that I have had difficulty sleeping. 0 2 1  I have felt sad or miserable. 0 0 0  I have been so unhappy that I have been crying. 0 0 0  The thought of harming myself has occurred to me. 0 0 0  Edinburgh Postnatal Depression Scale Total 0 2 1

## 2021-06-12 NOTE — Telephone Encounter (Signed)
Called patient at request of RN in the office. Spoke with patient. She reports she has not been breast feeding as often. She reports she breast feeds every now and then and pumps every now and then. The infant also lost weight and she was concerned infant was not getting enough.   Reviewed growth spurts and supply and demand. Reviewed that emptying the breast about 8 times a day to promote and protect milk supply.   Infant and mom with Lactation appointment next week. Reviewed location, date and time.   Reviewed to bring infant hungry, EBM/formula, and pump. Reviewed that she can bring support person to visit.   Patient voiced understanding.

## 2021-06-13 LAB — COMPREHENSIVE METABOLIC PANEL
ALT: 15 IU/L (ref 0–32)
AST: 21 IU/L (ref 0–40)
Albumin/Globulin Ratio: 2 (ref 1.2–2.2)
Albumin: 4.6 g/dL (ref 3.9–5.0)
Alkaline Phosphatase: 112 IU/L (ref 44–121)
BUN/Creatinine Ratio: 9 (ref 9–23)
BUN: 8 mg/dL (ref 6–20)
Bilirubin Total: 0.5 mg/dL (ref 0.0–1.2)
CO2: 22 mmol/L (ref 20–29)
Calcium: 10.2 mg/dL (ref 8.7–10.2)
Chloride: 108 mmol/L — ABNORMAL HIGH (ref 96–106)
Creatinine, Ser: 0.87 mg/dL (ref 0.57–1.00)
Globulin, Total: 2.3 g/dL (ref 1.5–4.5)
Glucose: 75 mg/dL (ref 70–99)
Potassium: 4 mmol/L (ref 3.5–5.2)
Sodium: 144 mmol/L (ref 134–144)
Total Protein: 6.9 g/dL (ref 6.0–8.5)
eGFR: 97 mL/min/{1.73_m2} (ref >59)

## 2021-06-13 LAB — CBC
Hematocrit: 40.5 % (ref 34.0–46.6)
Hemoglobin: 13.4 g/dL (ref 11.1–15.9)
MCH: 26.9 pg (ref 26.6–33.0)
MCHC: 33.1 g/dL (ref 31.5–35.7)
MCV: 81 fL (ref 79–97)
Platelets: 229 10*3/uL (ref 150–450)
RBC: 4.98 x10E6/uL (ref 3.77–5.28)
RDW: 17 % — ABNORMAL HIGH (ref 11.7–15.4)
WBC: 6 10*3/uL (ref 3.4–10.8)

## 2021-06-14 DIAGNOSIS — Z419 Encounter for procedure for purposes other than remedying health state, unspecified: Secondary | ICD-10-CM | POA: Diagnosis not present

## 2021-06-19 ENCOUNTER — Telehealth (INDEPENDENT_AMBULATORY_CARE_PROVIDER_SITE_OTHER): Payer: Medicaid Other | Admitting: *Deleted

## 2021-06-19 ENCOUNTER — Encounter: Payer: Self-pay | Admitting: *Deleted

## 2021-06-19 DIAGNOSIS — Z0130 Encounter for examination of blood pressure without abnormal findings: Secondary | ICD-10-CM

## 2021-06-19 NOTE — Progress Notes (Signed)
Called pt and she did not answer. I left message stating that I am calling to complete her video visit for BP check.  I will call back in 10-15 minutes.   11:06  Called pt again and she did not answer. I left another VM message stating that we would like her to reschedule the video appt for BP check.  Mychart message was also sent to pt with this information.

## 2021-06-23 NOTE — Progress Notes (Signed)
Patient was assessed and managed by nursing staff during this encounter. I have reviewed the chart and agree with the documentation and plan. I have also made any necessary editorial changes.  Aletha Halim, MD 06/23/2021 3:37 PM

## 2021-06-24 ENCOUNTER — Ambulatory Visit (INDEPENDENT_AMBULATORY_CARE_PROVIDER_SITE_OTHER): Payer: Medicaid Other | Admitting: Student

## 2021-06-24 ENCOUNTER — Encounter: Payer: Self-pay | Admitting: Student

## 2021-06-24 ENCOUNTER — Other Ambulatory Visit: Payer: Self-pay

## 2021-06-24 ENCOUNTER — Other Ambulatory Visit (HOSPITAL_COMMUNITY)
Admission: RE | Admit: 2021-06-24 | Discharge: 2021-06-24 | Disposition: A | Discharge status: Home / Self Care | Payer: Medicaid Other | Source: Ambulatory Visit

## 2021-06-24 VITALS — BP 125/94 | HR 85 | Wt 122.0 lb

## 2021-06-24 DIAGNOSIS — Z124 Encounter for screening for malignant neoplasm of cervix: Secondary | ICD-10-CM | POA: Insufficient documentation

## 2021-06-24 MED ORDER — LABETALOL HCL 200 MG PO TABS: 200.0000 mg | ORAL_TABLET | Freq: Two times a day (BID) | ORAL | 1 refills | Status: AC

## 2021-06-24 NOTE — Progress Notes (Signed)
Post Partum Visit Note  Michelle Stewart is a 21 y.o. G38P1001 female who presents for a postpartum visit. She is  5.4  weeks postpartum following a normal spontaneous vaginal delivery.  I have fully reviewed the prenatal and intrapartum course. The delivery was at 39.6 gestational weeks.  Anesthesia: epidural. Postpartum course has been uneventful. Baby is doing well. Baby is feeding by both breast and bottle - Similac Advance. Bleeding no bleeding. Bowel function is normal. Bladder function is normal. Patient is not sexually active. Contraception method is Depo-Provera injections. Postpartum depression screening: negative. She reports that she is on her "third blood pressure medicine".   The pregnancy intention screening data noted above was reviewed. Potential methods of contraception were discussed. The patient elected to proceed with No data recorded.   Edinburgh Postnatal Depression Scale - 06/24/21 0940       Edinburgh Postnatal Depression Scale:  In the Past 7 Days   I have been able to laugh and see the funny side of things. 0    I have looked forward with enjoyment to things. 0    I have blamed myself unnecessarily when things went wrong. 0    I have been anxious or worried for no good reason. 0    I have felt scared or panicky for no good reason. 0    Things have been getting on top of me. 0    I have been so unhappy that I have had difficulty sleeping. 0    I have felt sad or miserable. 0    I have been so unhappy that I have been crying. 0    The thought of harming myself has occurred to me. 0    Edinburgh Postnatal Depression Scale Total 0             Health Maintenance Due  Topic Date Due   COVID-19 Vaccine (1) Never done   HPV VACCINES (1 - 2-dose series) Never done   PAP-Cervical Cytology Screening  Never done   PAP SMEAR-Modifier  Never done    The following portions of the patient's history were reviewed and updated as appropriate: allergies, current  medications, past family history, past medical history, past social history, past surgical history, and problem list.  Review of Systems Pertinent items are noted in HPI.  Objective:  BP (!) 125/94    Pulse 85    Wt 122 lb (55.3 kg)    Breastfeeding Yes    BMI 23.05 kg/m    General:  alert and cooperative   Breasts:  normal  Lungs: clear to auscultation bilaterally  Heart:  regular rate and rhythm, S1, S2 normal, no murmur, click, rub or gallop  Abdomen: NA    Wound NA  GU exam:  normal       Assessment:      Healthy postpartum exam.   Plan:   Essential components of care per ACOG recommendations:  1.  Mood and well being: Patient with negative depression screening today. Reviewed local resources for support.  - Patient tobacco use? No.   - hx of drug use? No.    2. Infant care and feeding:  -Patient currently breastmilk feeding? Yes with formula feeding as well.  -Social determinants of health (SDOH) reviewed in EPIC. No concerns.The following needs were identified none  3. Sexuality, contraception and birth spacing - Patient does not want a pregnancy in the next year.  Desired family size is 1 children.  - Reviewed forms  of contraception in tiered fashion. Patient desired  depo  today.   - Discussed birth spacing of 18 months  4. Sleep and fatigue -Encouraged family/partner/community support of 4 hrs of uninterrupted sleep to help with mood and fatigue  5. Physical Recovery  - Discussed patients delivery and complications. She describes her labor as good. - Patient had a Vaginal, no problems at delivery. Patient had a  periurethral  laceration. Perineal healing reviewed. Patient expressed understanding - Patient has urinary incontinence? No. - Patient is safe to resume physical and sexual activity  6.  Health Maintenance - HM due items addressed Yes - Last pap smear  Pap smear done at today's visit.  -Breast Cancer screening indicated?   7. Chronic  Disease/Pregnancy Condition follow up: Hypertension Discussed with Dr. Crissie Reese; will have patient change to Labetalol 400 mg BID and needs BP check in two weeks; patient has name of PCP and is encouraged to start her care with PCP  - PCP follow up  Marylene Land, CNM [facility]

## 2021-06-26 ENCOUNTER — Encounter: Payer: Self-pay | Admitting: Student

## 2021-06-26 ENCOUNTER — Ambulatory Visit: Payer: Medicaid Other

## 2021-06-26 LAB — CYTOLOGY - PAP

## 2021-06-29 ENCOUNTER — Ambulatory Visit: Payer: Medicaid Other | Admitting: Podiatry

## 2021-07-03 ENCOUNTER — Encounter: Payer: Self-pay | Admitting: Student

## 2021-07-03 DIAGNOSIS — R87612 Low grade squamous intraepithelial lesion on cytologic smear of cervix (LGSIL): Secondary | ICD-10-CM | POA: Insufficient documentation

## 2021-07-03 NOTE — BH Specialist Note (Signed)
Integrated Behavioral Health via Telemedicine Visit  07/03/2021 Michelle Stewart [id number]  Number of Integrated Behavioral Health visits: 1 Session Start time: 8:17  Session End time: 8:43 Total time:  26  Referring Provider: Butler Bing, MD Patient/Family location: Home [facility] Provider location: Center for [facility]  All persons participating in visit: Patient Michelle Stewart and [facility] Mahima Hottle   Types of Service: Individual psychotherapy and Video visit  I connected with Kayana Janney and/or Avonne Dieter's  n/a  via  Telephone or Engineer, civil (consulting)  (Video is Caregility application) and verified that I am speaking with the correct person using two identifiers. Discussed confidentiality: Yes   I discussed the limitations of telemedicine and the availability of in person appointments.  Discussed there is a possibility of technology failure and discussed alternative modes of communication if that failure occurs.  I discussed that engaging in this telemedicine visit, they consent to the provision of behavioral healthcare and the services will be billed under their insurance.  Patient and/or legal guardian expressed understanding and consented to Telemedicine visit: Yes   Presenting Concerns: Patient and/or family reports the following symptoms/concerns: Adjusting to new motherhood; baby's sleep is improving, becoming less fussy; pt and baby are bonding well; pt's goal is to find a new job that will allow more work/life balance. Duration of problem: Postpartum; Severity of problem: mild  Patient and/or Family's Strengths/Protective Factors: Social connections, Concrete supports in place (healthy food, safe environments, etc.), Sense of purpose, and Physical Health (exercise, healthy diet, medication compliance, etc.)  Goals Addressed: Patient will:  Reduce symptoms of: stress   Demonstrate ability to:  Increase healthy adjustment to current life circumstances  Progress towards Goals: Achieved  Interventions: Interventions utilized:  Psychoeducation and/or Health Education, Link to [employer], and Supportive Reflection Standardized Assessments completed: GAD-7 and PHQ 9  Patient and/or Family Response: Pt agrees with treatment plan  Assessment: Patient currently experiencing Other specified counseling and Psychosocial stress.   Patient may benefit from psychoeducation and brief therapeutic interventions regarding managing life stress .  Plan: Follow up with behavioral health clinician on : Call Anani Gu at [phone], as needed Behavioral recommendations:  -Continue prioritizing work/home balance in life -Consider resources on After Visit Summary, as needed Referral(s): Integrated Art gallery manager (In Clinic) and [employer]:  new mom support  I discussed the assessment and treatment plan with the patient and/or parent/guardian. They were provided an opportunity to ask questions and all were answered. They agreed with the plan and demonstrated an understanding of the instructions.   They were advised to call back or seek an in-person evaluation if the symptoms worsen or if the condition fails to improve as anticipated.  Rae Lips, LCSW  Depression screen [facility] 2/9 07/07/2021 05/12/2021 04/29/2021 04/22/2021 04/07/2021  Decreased Interest 0 0 0 0 0  Down, Depressed, Hopeless 0 0 0 0 0  PHQ - 2 Score 0 0 0 0 0  Altered sleeping 0 1 1 2 2   Tired, decreased energy 1 1 1 2 2   Change in appetite 0 0 0 0 0  Feeling bad or failure about yourself  0 0 0 0 0  Trouble concentrating 0 0 0 0 0  Moving slowly or fidgety/restless 0 0 0 0 0  Suicidal thoughts 0 0 0 0 0  PHQ-9 Score 1 2 2 4 4   Some recent data might be hidden   GAD 7 : Generalized Anxiety Score 07/07/2021 05/12/2021  04/29/2021 04/22/2021  Nervous, Anxious, on Edge 0 0 0 0  Control/stop  worrying 0 0 0 0  Worry too much - different things 0 0 0 0  Trouble relaxing 0 1 0 1  Restless 0 0 0 0  Easily annoyed or irritable 0 0 0 0  Afraid - awful might happen 1 0 0 0  Total GAD 7 Score 1 1 0 1

## 2021-07-07 ENCOUNTER — Ambulatory Visit (INDEPENDENT_AMBULATORY_CARE_PROVIDER_SITE_OTHER): Payer: Medicaid Other | Admitting: Clinical

## 2021-07-07 DIAGNOSIS — Z658 Other specified problems related to psychosocial circumstances: Secondary | ICD-10-CM

## 2021-07-07 DIAGNOSIS — Z7189 Other specified counseling: Secondary | ICD-10-CM | POA: Diagnosis not present

## 2021-07-07 NOTE — Patient Instructions (Addendum)
Center for [facility] at [facility] [address] [place], [place] [address] [phone] (main office) [phone] (Paw Karstens's office)  New mom support groups at:  [email or web address].net [email or web address]  [email or web address] (help with updating resume, etc.) [email or web address]

## 2021-07-08 ENCOUNTER — Ambulatory Visit: Payer: Medicaid Other

## 2021-07-15 DIAGNOSIS — Z419 Encounter for procedure for purposes other than remedying health state, unspecified: Secondary | ICD-10-CM | POA: Diagnosis not present

## 2021-08-12 DIAGNOSIS — Z419 Encounter for procedure for purposes other than remedying health state, unspecified: Secondary | ICD-10-CM | POA: Diagnosis not present

## 2021-08-28 DIAGNOSIS — Z3202 Encounter for pregnancy test, result negative: Secondary | ICD-10-CM | POA: Diagnosis not present

## 2021-08-28 DIAGNOSIS — Z3009 Encounter for other general counseling and advice on contraception: Secondary | ICD-10-CM | POA: Diagnosis not present

## 2021-09-12 DIAGNOSIS — Z419 Encounter for procedure for purposes other than remedying health state, unspecified: Secondary | ICD-10-CM | POA: Diagnosis not present

## 2021-10-12 DIAGNOSIS — Z419 Encounter for procedure for purposes other than remedying health state, unspecified: Secondary | ICD-10-CM | POA: Diagnosis not present

## 2021-11-12 DIAGNOSIS — Z419 Encounter for procedure for purposes other than remedying health state, unspecified: Secondary | ICD-10-CM | POA: Diagnosis not present

## 2021-12-12 DIAGNOSIS — Z419 Encounter for procedure for purposes other than remedying health state, unspecified: Secondary | ICD-10-CM | POA: Diagnosis not present

## 2022-01-12 DIAGNOSIS — Z419 Encounter for procedure for purposes other than remedying health state, unspecified: Secondary | ICD-10-CM | POA: Diagnosis not present

## 2022-01-26 IMAGING — US US OB TRANSVAGINAL
1 series · 15 of 28 positions shown · non-contrast
Comparison: 09/07/2020

CLINICAL DATA: Uncertain LMP, first trimester pregnancy; LMP
08/02/2020

EXAM:
TRANSVAGINAL OB ULTRASOUND
TECHNIQUE: Transvaginal ultrasound was performed for complete evaluation of the
gestation as well as the maternal uterus, adnexal regions, and
pelvic cul-de-sac.

[Series 1: us ob transvaginal · 15 of 53 slices shown]
[im 1/53]
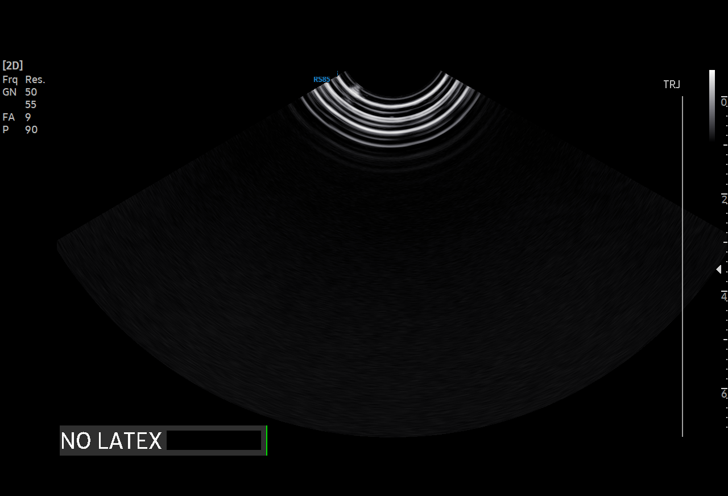
[im 4/53]
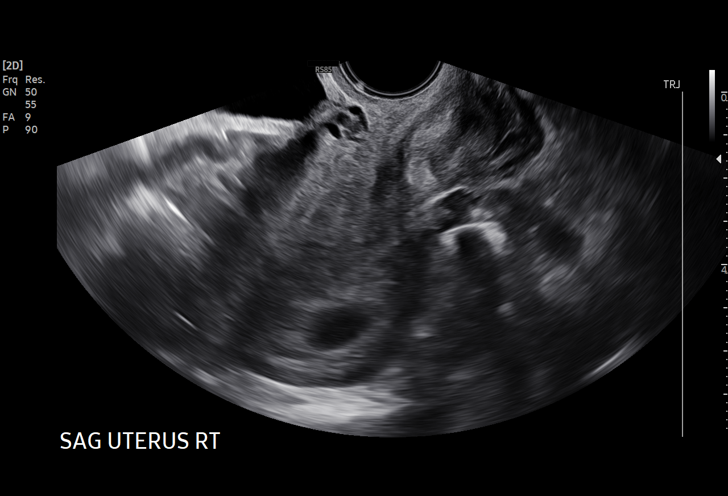
[im 8/53]
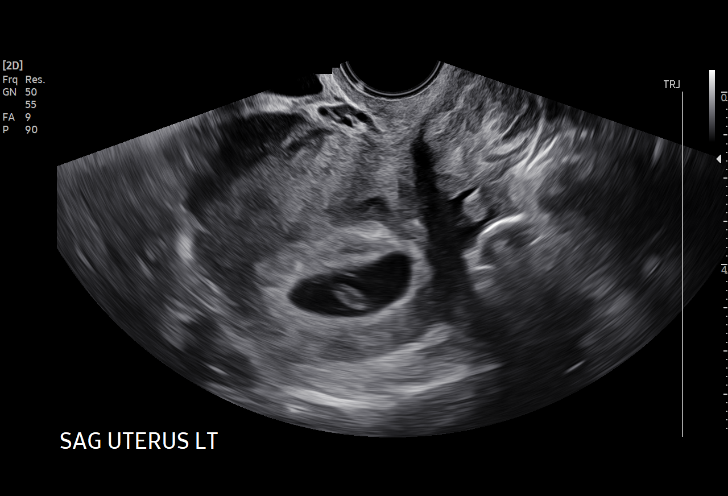
[im 12/53]
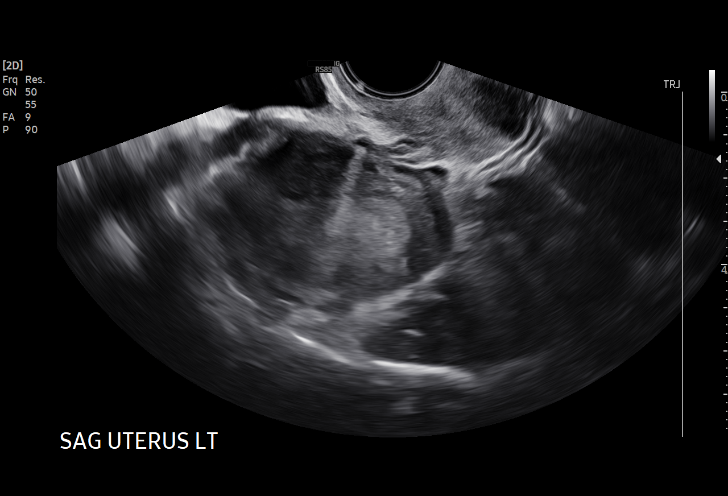
[im 16/53]
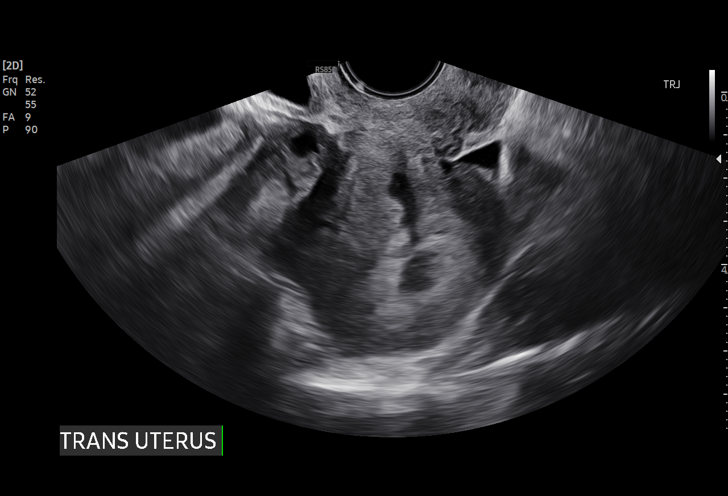
[im 20/53]
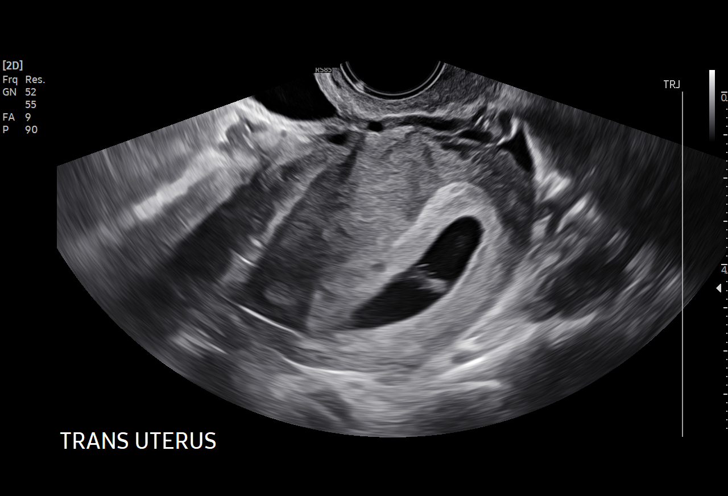
[im 24/53]
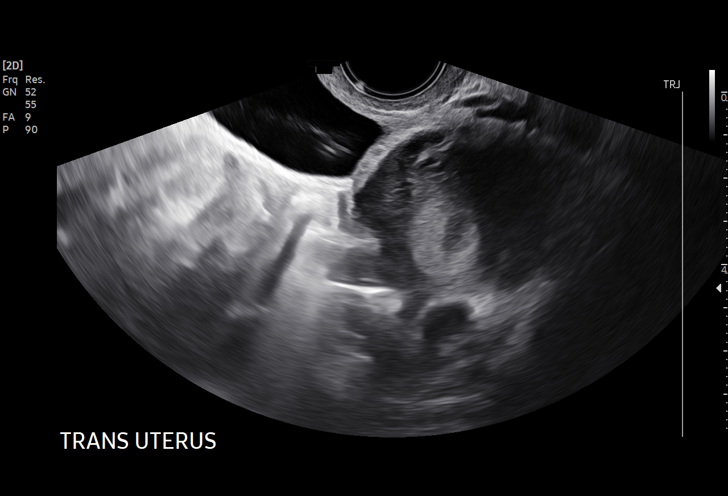
[im 27/53]
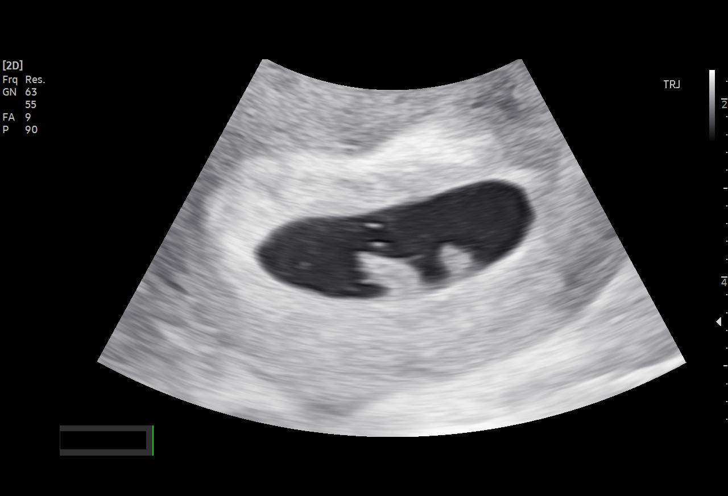
[im 29/53]
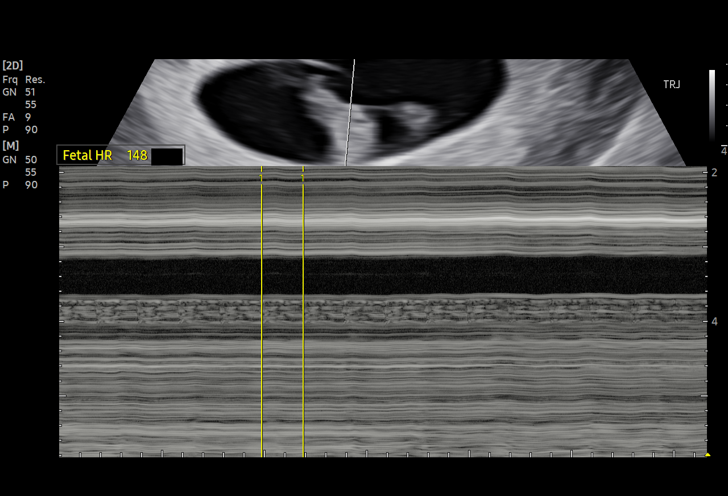
[im 33/53]
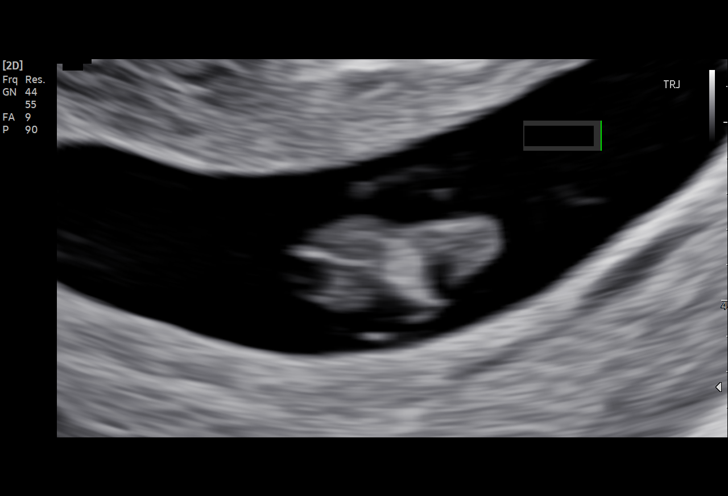
[im 37/53]
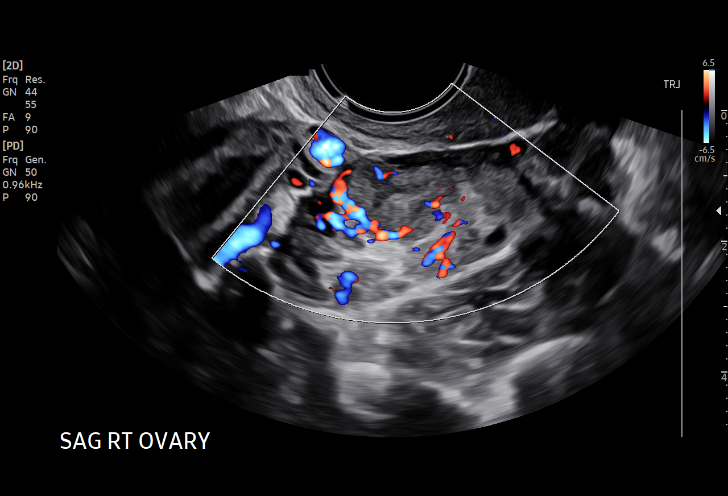
[im 41/53]
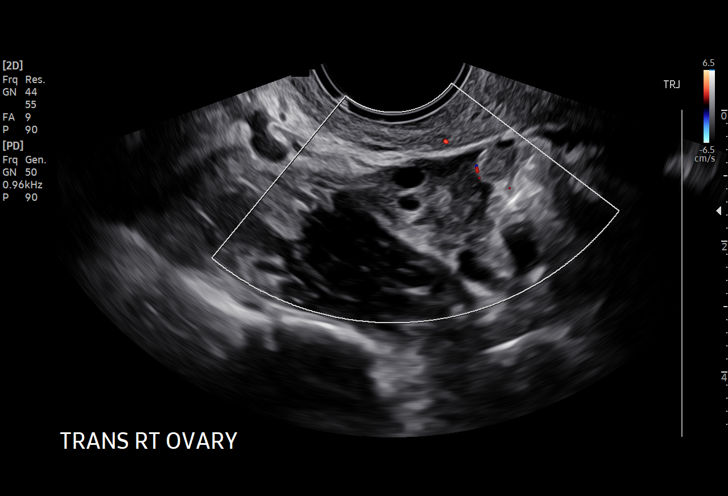
[im 45/53]
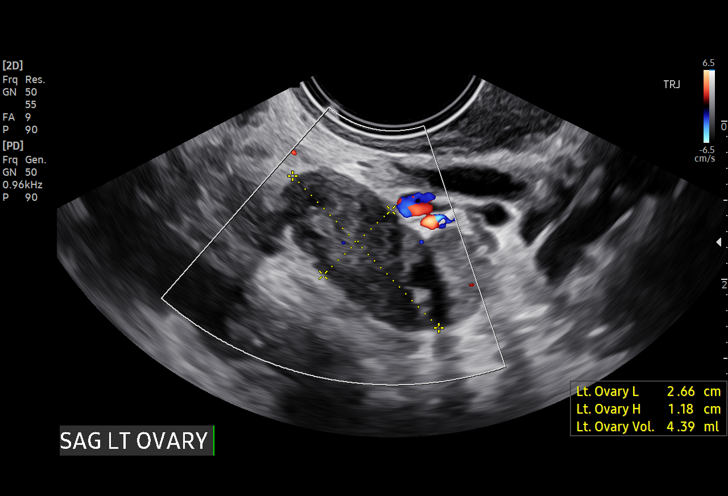
[im 49/53]
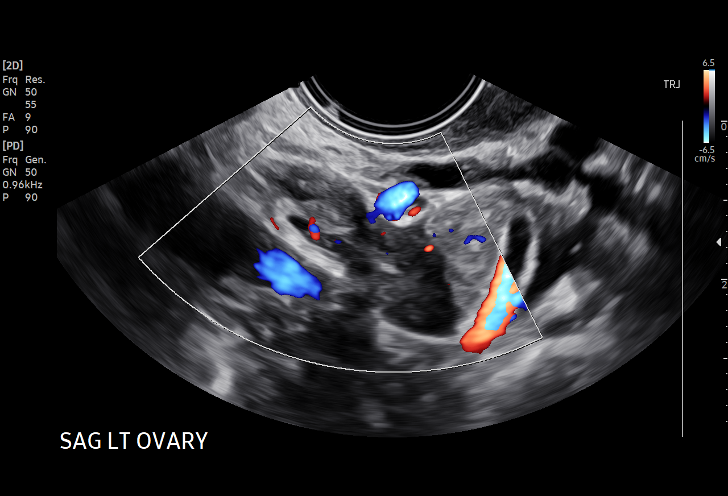
[im 53/53]
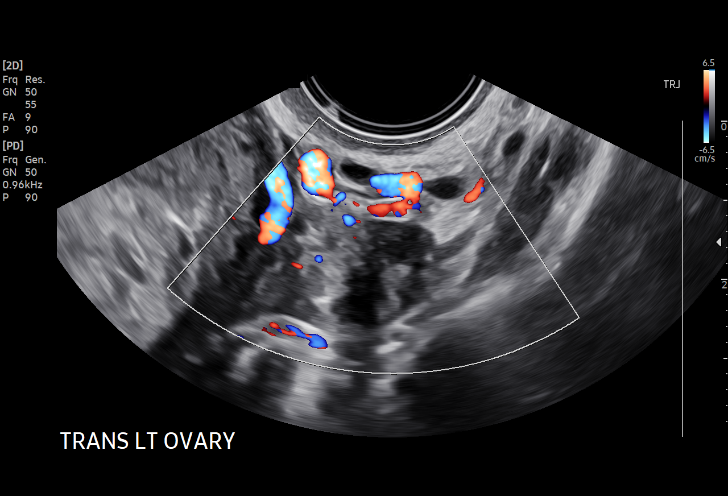

[15 of 28 positions shown; findings below may reference images not displayed]

FINDINGS: Intrauterine gestational sac: Present, single

Yolk sac:  Present

Embryo:  Present

Cardiac Activity: Present

Heart Rate: 148 bpm

CRL:   12.3 mm   7 w 3 d                  US EDC: 05/17/2021

Subchorionic hemorrhage: Questionable tiny subchronic hemorrhage
image 18

Maternal uterus/adnexae:

Remainder of uterus unremarkable.

Small corpus luteum RIGHT ovary.

Ovaries otherwise normal appearance.

Trace free pelvic fluid, nonspecific.

No adnexal masses.
IMPRESSION: Single live intrauterine gestation at 7 weeks 3 days EGA by
crown-rump length.

Questionable tiny subchronic hemorrhage.

## 2022-02-12 DIAGNOSIS — Z419 Encounter for procedure for purposes other than remedying health state, unspecified: Secondary | ICD-10-CM | POA: Diagnosis not present

## 2022-02-23 ENCOUNTER — Ambulatory Visit: Payer: Medicaid Other | Admitting: Podiatry

## 2022-03-14 DIAGNOSIS — Z419 Encounter for procedure for purposes other than remedying health state, unspecified: Secondary | ICD-10-CM | POA: Diagnosis not present

## 2022-03-16 DIAGNOSIS — N760 Acute vaginitis: Secondary | ICD-10-CM | POA: Diagnosis not present

## 2022-03-16 DIAGNOSIS — N898 Other specified noninflammatory disorders of vagina: Secondary | ICD-10-CM | POA: Diagnosis not present

## 2022-03-16 DIAGNOSIS — N888 Other specified noninflammatory disorders of cervix uteri: Secondary | ICD-10-CM | POA: Diagnosis not present

## 2022-03-16 DIAGNOSIS — B9689 Other specified bacterial agents as the cause of diseases classified elsewhere: Secondary | ICD-10-CM | POA: Diagnosis not present

## 2022-04-02 DIAGNOSIS — R195 Other fecal abnormalities: Secondary | ICD-10-CM | POA: Diagnosis not present

## 2022-04-02 DIAGNOSIS — B3731 Acute candidiasis of vulva and vagina: Secondary | ICD-10-CM | POA: Diagnosis not present

## 2022-04-02 DIAGNOSIS — N854 Malposition of uterus: Secondary | ICD-10-CM | POA: Diagnosis not present

## 2022-04-02 DIAGNOSIS — N760 Acute vaginitis: Secondary | ICD-10-CM | POA: Diagnosis not present

## 2022-04-02 DIAGNOSIS — N3289 Other specified disorders of bladder: Secondary | ICD-10-CM | POA: Diagnosis not present

## 2022-04-02 DIAGNOSIS — B9689 Other specified bacterial agents as the cause of diseases classified elsewhere: Secondary | ICD-10-CM | POA: Diagnosis not present

## 2022-04-02 DIAGNOSIS — R102 Pelvic and perineal pain: Secondary | ICD-10-CM | POA: Diagnosis not present

## 2022-04-14 DIAGNOSIS — Z419 Encounter for procedure for purposes other than remedying health state, unspecified: Secondary | ICD-10-CM | POA: Diagnosis not present

## 2022-04-17 IMAGING — US US MFM OB LIMITED
1 series · 14 of 28 positions shown · non-contrast
Comparison: none

[Series 1: us mfm ob limited · 14 of 52 slices shown]
[im 2/52]
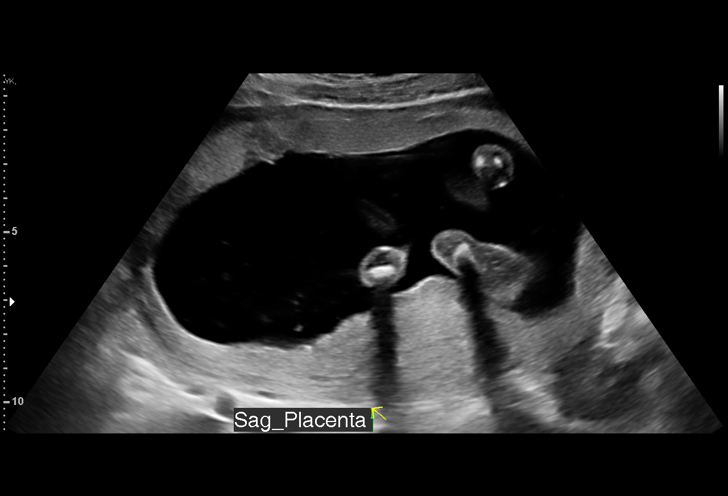
[im 6/52]
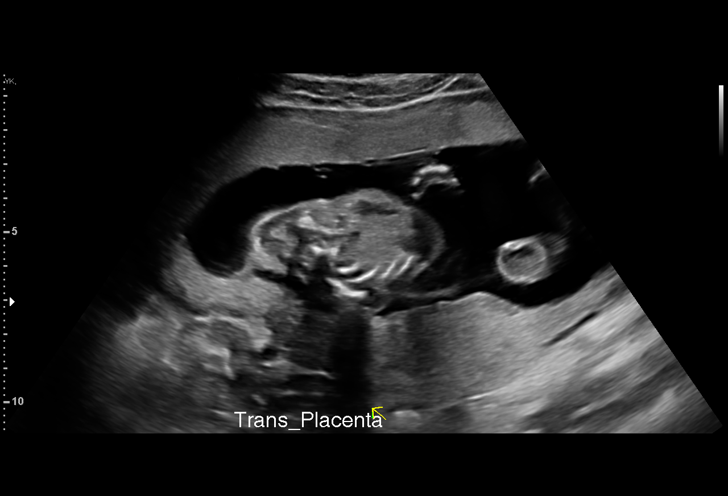
[im 10/52]
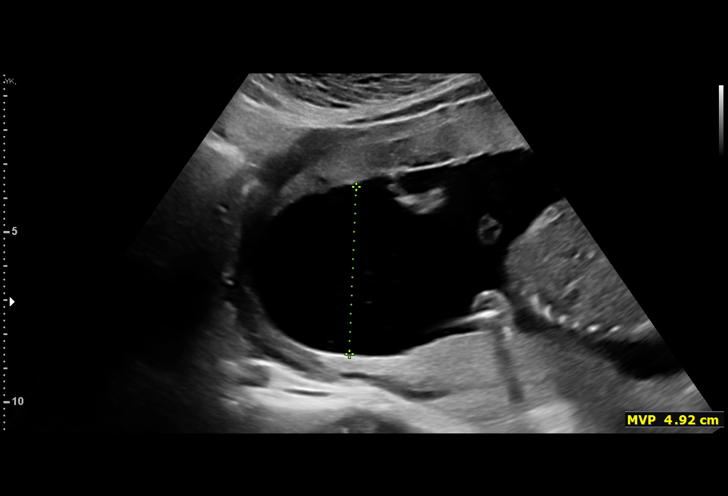
[im 14/52]
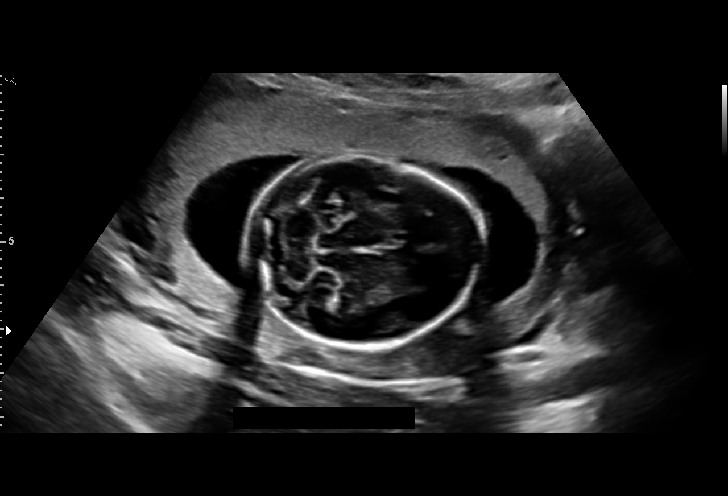
[im 18/52]
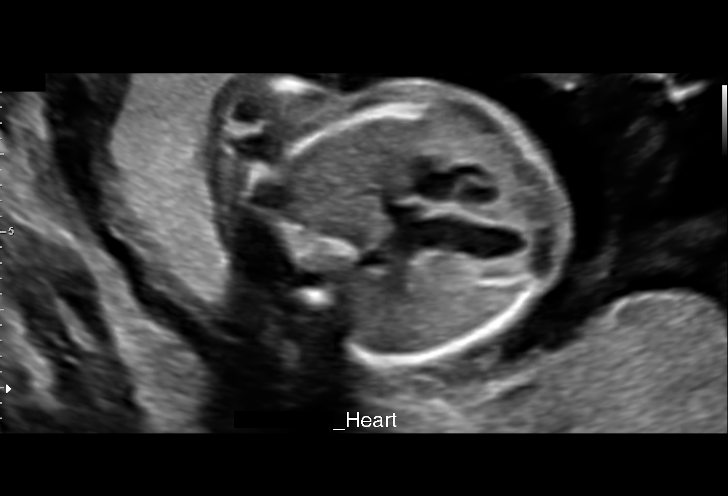
[im 21/52]
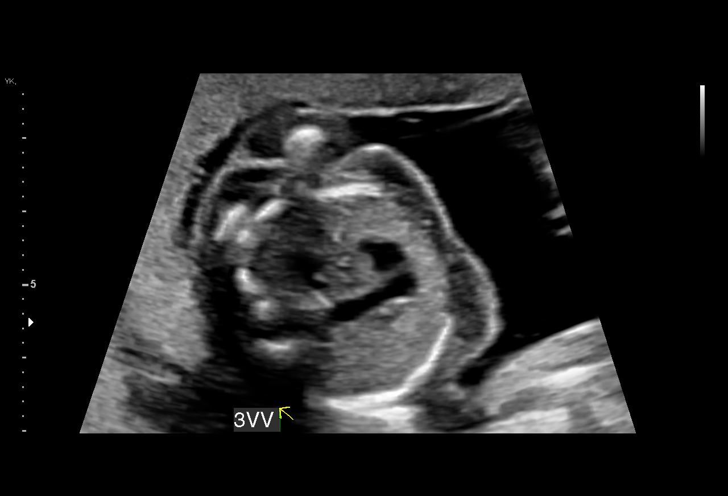
[im 25/52]
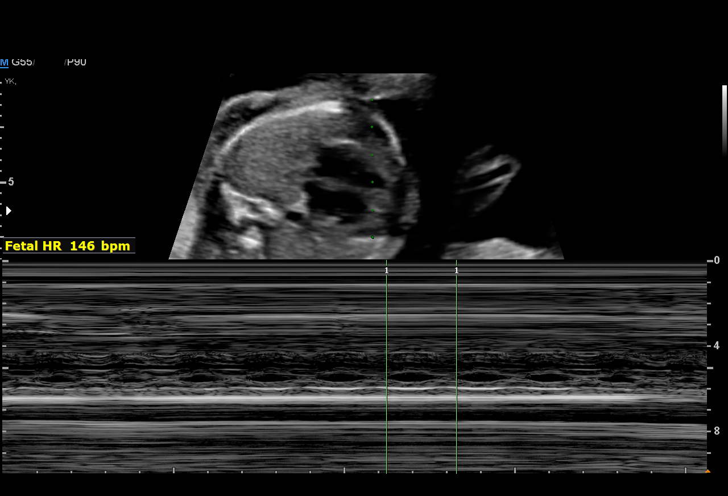
[im 29/52]
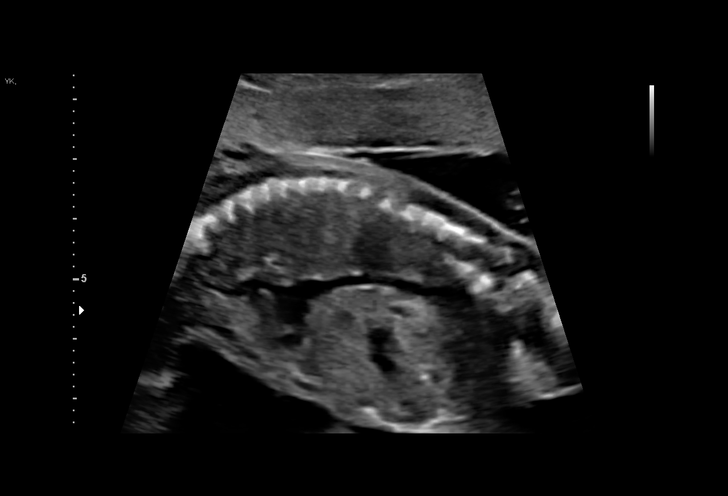
[im 33/52]
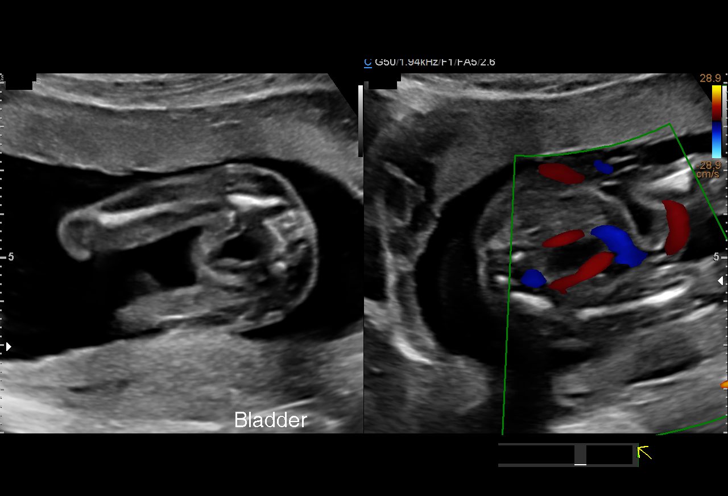
[im 36/52]
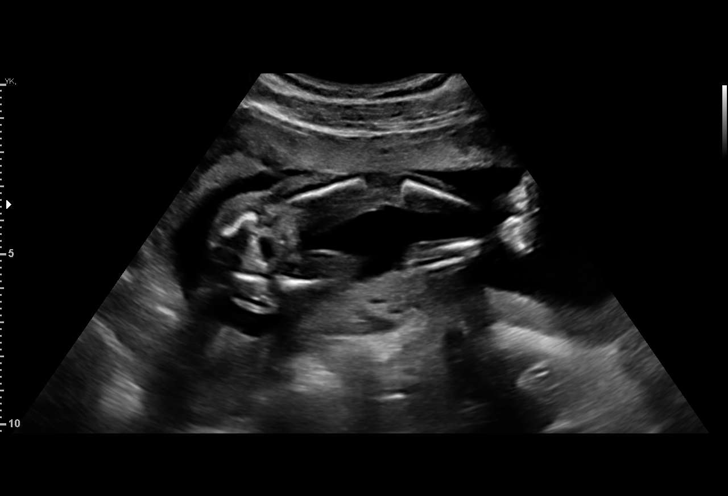
[im 40/52]
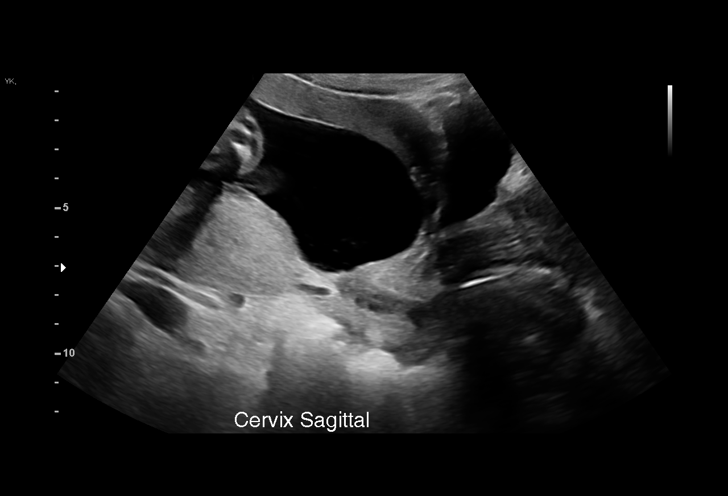
[im 44/52]
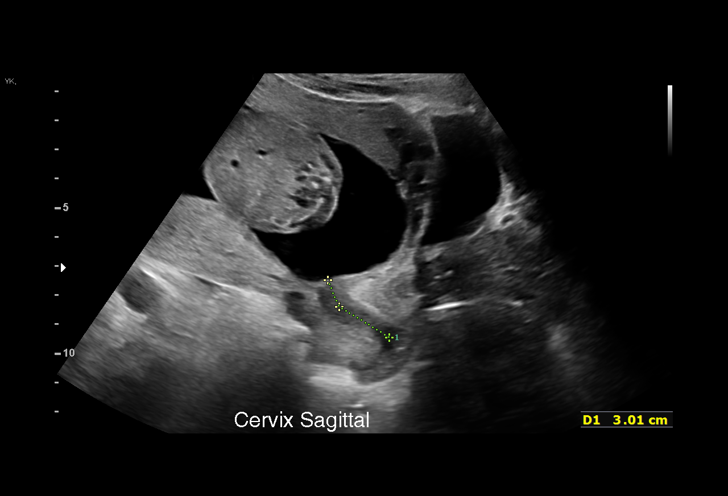
[im 48/52]
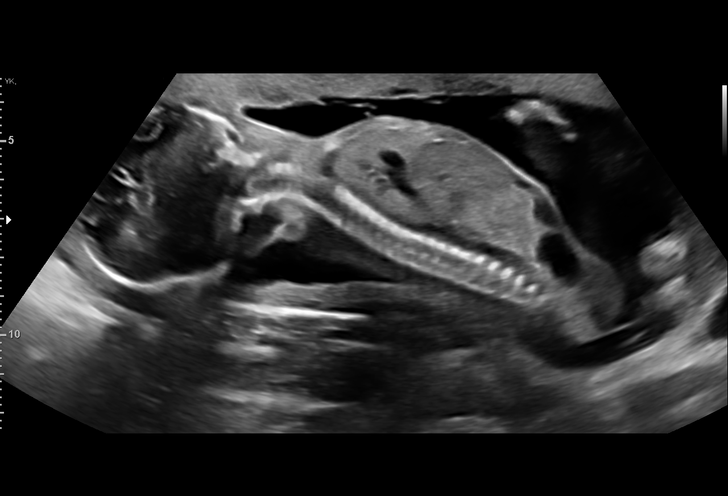
[im 52/52]
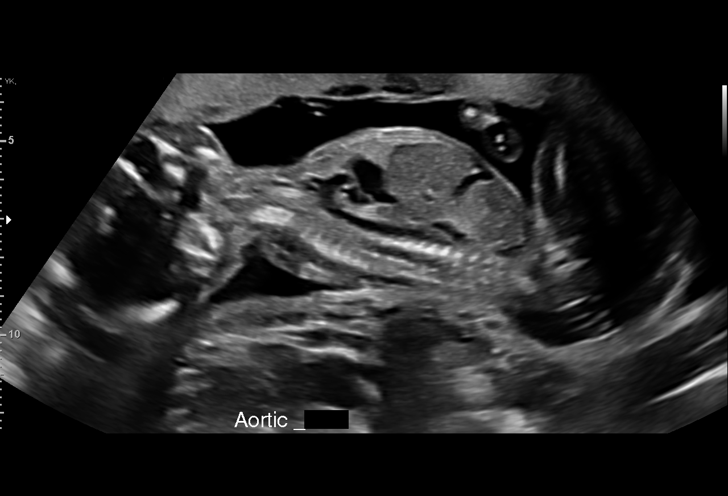

[14 of 28 positions shown; findings below may reference images not displayed]

1  US MFM OB LIMITED                     76815.01    ESSAFI JADIDA

Indications

 Vaginal bleeding in pregnancy, second
 trimester
 19 weeks gestation of pregnancy
 Encounter for cervical length
 Abdominal pain in pregnancy
Fetal Evaluation

 Num Of Fetuses:         1
 Fetal Heart Rate(bpm):  140
 Cardiac Activity:       Observed
 Presentation:           Breech
 Placenta:               Anterior
 P. Cord Insertion:      Visualized, central

 Amniotic Fluid
 AFI FV:      Within normal limits

                             Largest Pocket(cm)


 Comment:    No placental abruption or previa identified.
Gestational Age

 LMP:           20w 1d        Date:  08/02/20                 EDD:   05/09/21
 Best:          19w 0d     Det. By:  Early Ultrasound         EDD:   05/17/21
                                     (10/01/20)
Anatomy
 Cranium:               Appears normal         Diaphragm:              Appears normal
 Cavum:                 Appears normal         Stomach:                Appears normal, left
                                                                       sided
 Ventricles:            Appears normal         Abdomen:                Appears normal
 Choroid Plexus:        Appears normal         Abdominal Wall:         Appears nml (cord
                                                                       insert, abd wall)
 Cerebellum:            Appears normal         Cord Vessels:           Appears normal (3
                                                                       vessel cord)
 Posterior Fossa:       Appears normal         Kidneys:                Appear normal
 Heart:                 Appears normal         Bladder:                Appears normal
                        (4CH, axis, and
                        situs)
 Aortic Arch:           Appears normal         Lower Extremities:      Visualized
 Ductal Arch:           Appears normal
Cervix Uterus Adnexa

 Cervix
 Length:              3  cm.
 Normal appearance by transabdominal scan.

 Uterus
 No abnormality visualized.
Impression

 Patient was evaluated for c/o vaginal bleeding .
 A limited ultrasound study was performed .Amniotic fluid is
 normal and good fetal activity is seen . Placenta appears
 normal with no evidence of previa.
 On transabdominal scan, the cervix looks long and closed .
                 Leura, Demit Fasade

## 2022-04-18 IMAGING — US US MFM OB DETAIL+14 WK
1 series · 13 of 28 positions shown · non-contrast
Comparison: none

[Series 1: us mfm ob detail+14 wk · 13 of 138 slices shown]
[im 6/138]
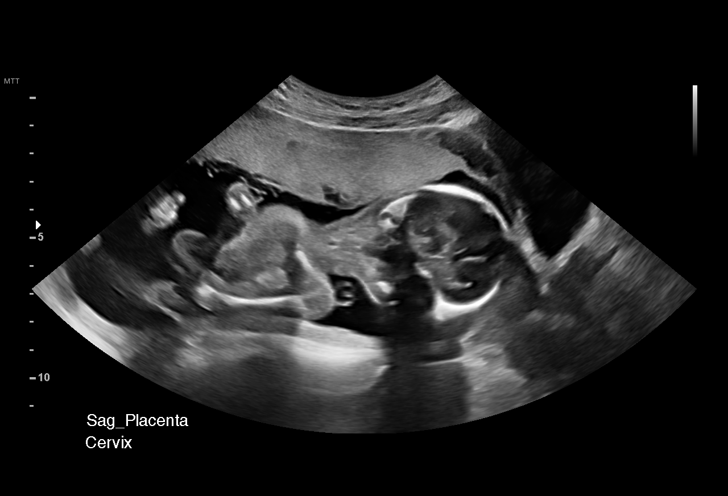
[im 16/138]
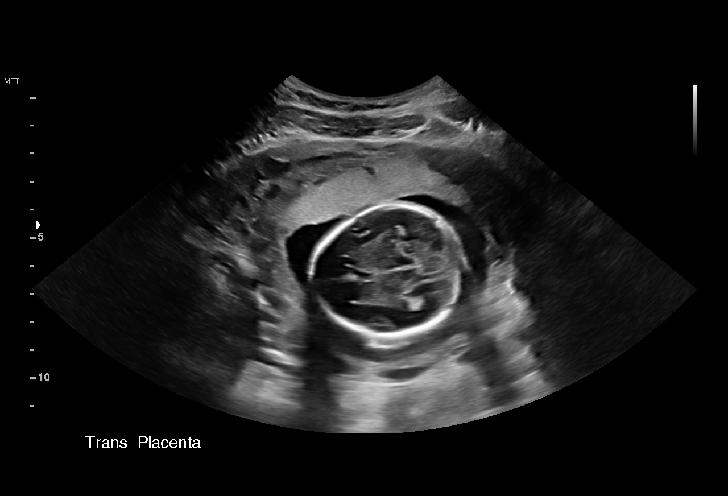
[im 26/138]
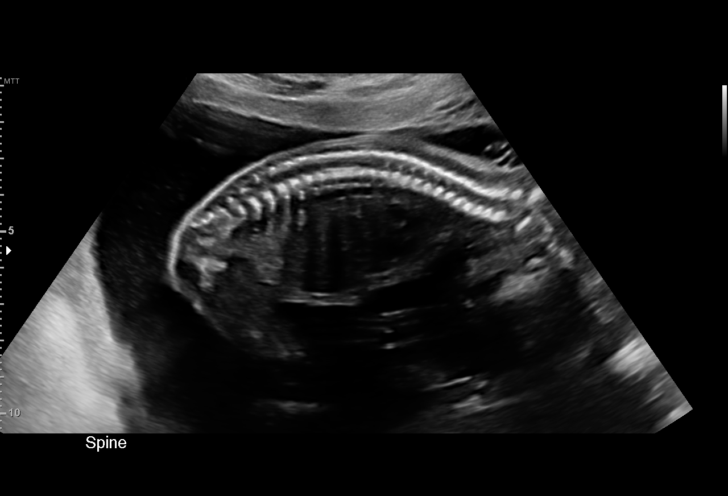
[im 36/138]
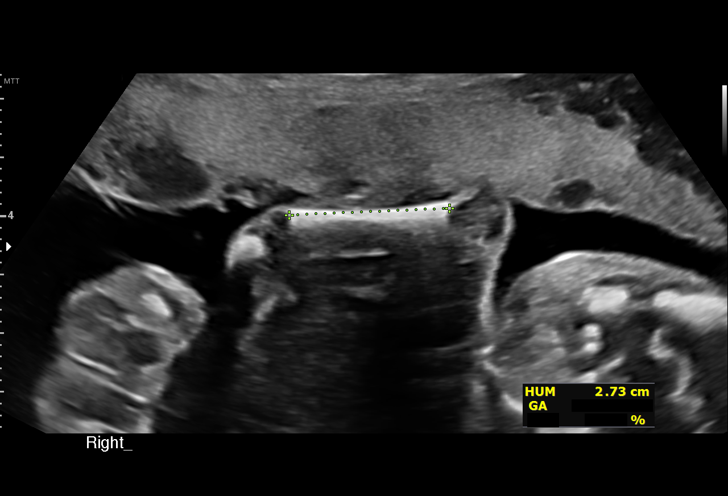
[im 46/138]
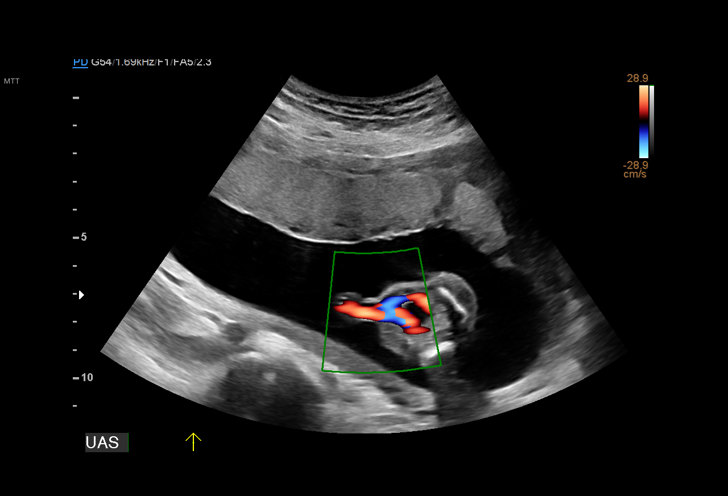
[im 56/138]
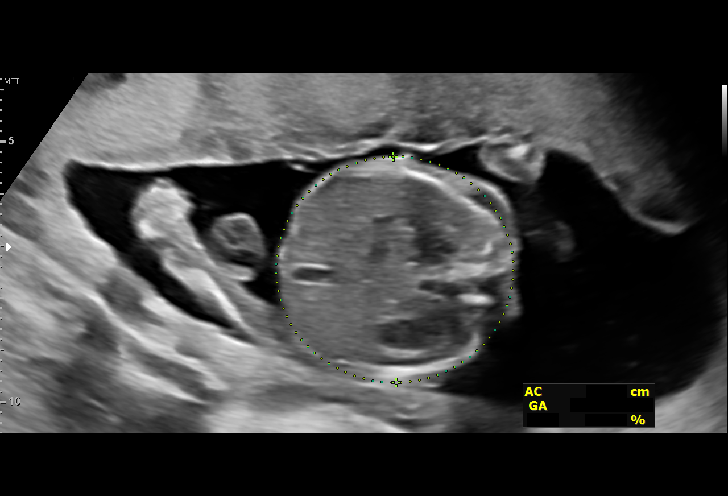
[im 72/138]
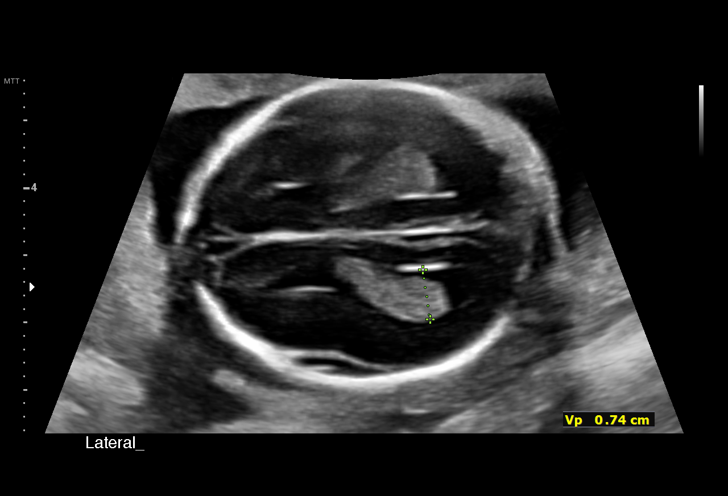
[im 82/138]
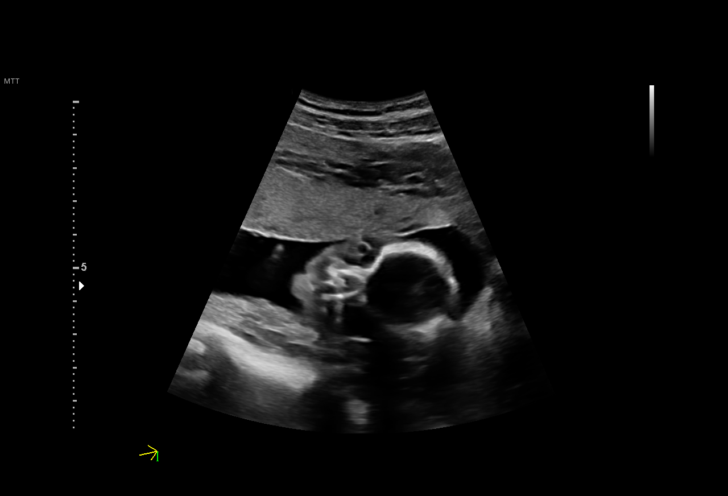
[im 92/138]
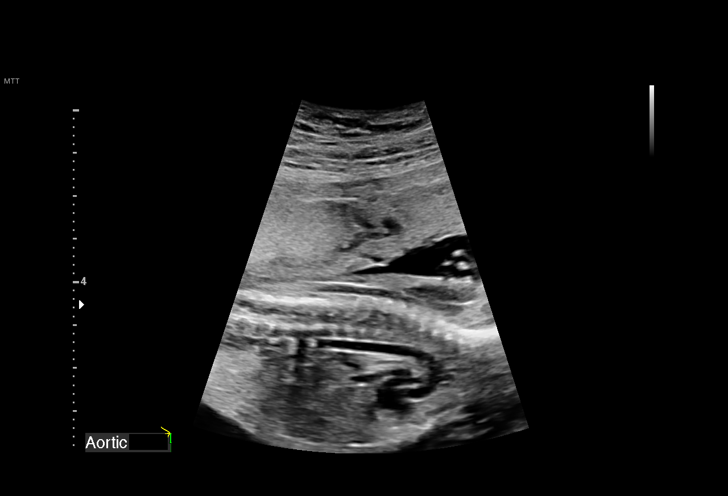
[im 102/138]
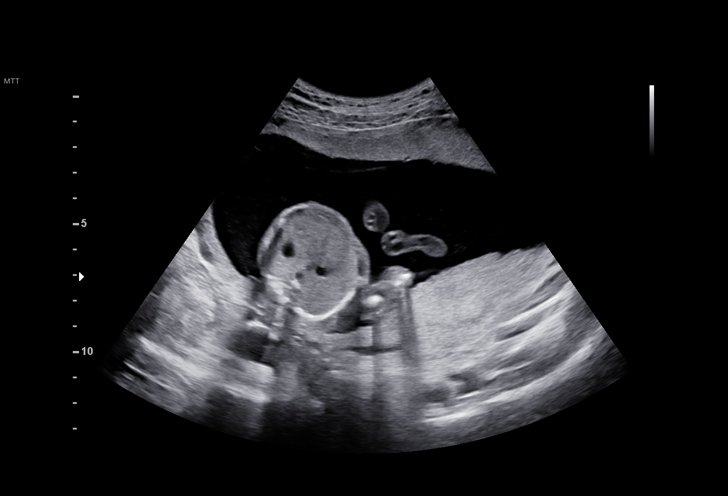
[im 112/138]
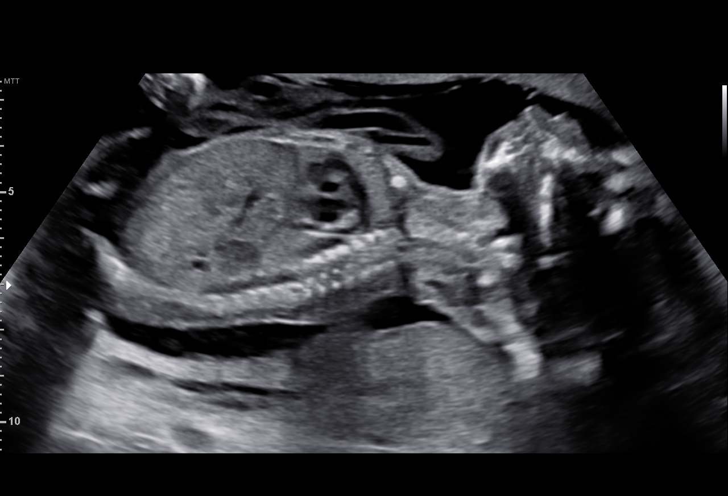
[im 122/138]
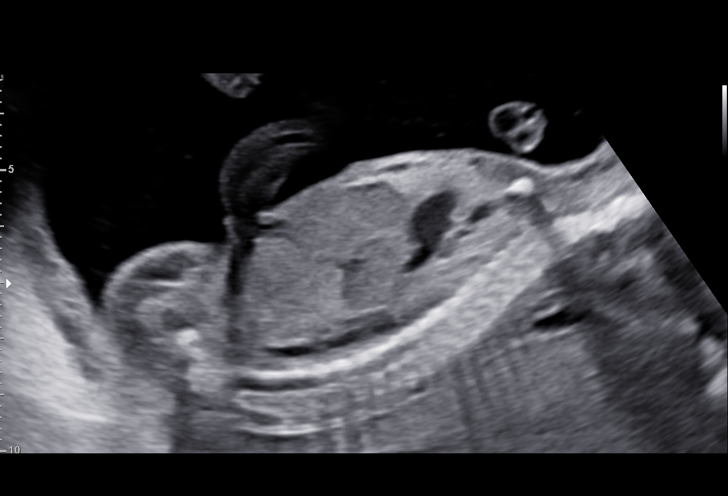
[im 132/138]
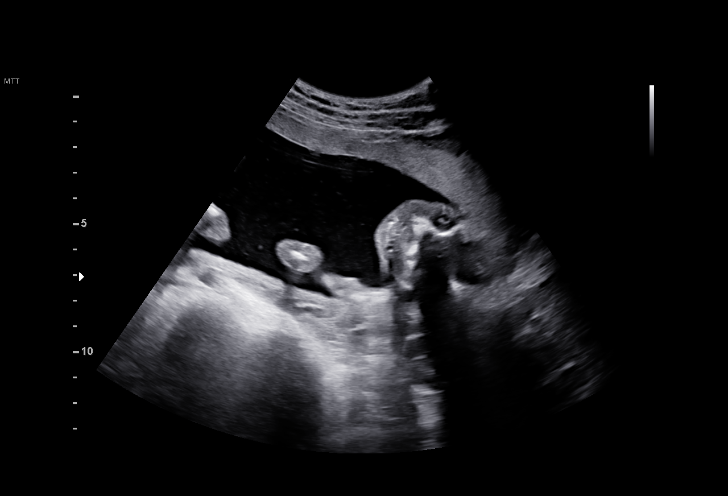

[13 of 28 positions shown; findings below may reference images not displayed]

Indications

 Echogenic intracardiac focus of the heart
 (EIF)
 19 weeks gestation of pregnancy
 Encounter for antenatal screening for
 malformations
 LR NIPS/ Negative Horizon/ Negative AFP
Fetal Evaluation

 Num Of Fetuses:         1
 Fetal Heart Rate(bpm):  129
 Cardiac Activity:       Observed
 Presentation:           Cephalic
 Placenta:               Anterior
 P. Cord Insertion:      Previously Visualized

 Amniotic Fluid
 AFI FV:      Within normal limits

                             Largest Pocket(cm)

Biometry

 BPD:      44.4  mm     G. Age:  19w 3d         63  %    CI:        73.54   %    70 - 86
                                                         FL/HC:      17.7   %    16.1 -
 HC:      164.5  mm     G. Age:  19w 1d         44  %    HC/AC:      1.16        1.09 -
 AC:      142.1  mm     G. Age:  19w 4d         60  %    FL/BPD:     65.5   %
 FL:       29.1  mm     G. Age:  19w 0d         36  %    FL/AC:      20.5   %    20 - 24
 HUM:      27.8  mm     G. Age:  18w 6d         45  %
 CER:      19.9  mm     G. Age:  19w 2d         47  %
 NFT:       4.0  mm

 LV:        7.4  mm
 CM:        4.5  mm

 Est. FW:     284  gm    0 lb 10 oz      54  %
Gestational Age

 LMP:           20w 2d        Date:  08/02/20                 EDD:   05/09/21
 U/S Today:     19w 2d                                        EDD:   05/16/21
 Best:          19w 1d     Det. By:  Early Ultrasound         EDD:   05/17/21
                                     (10/01/20)
Anatomy

 Cranium:               Appears normal         LVOT:                   Not well visualized
 Cavum:                 Appears normal         Aortic Arch:            Appears normal
 Ventricles:            Appears normal         Ductal Arch:            Not well visualized
 Choroid Plexus:        Appears normal         Diaphragm:              Appears normal
 Cerebellum:            Appears normal         Stomach:                Appears normal, left
                                                                       sided
 Posterior Fossa:       Appears normal         Abdomen:                Appears normal
 Nuchal Fold:           Appears normal         Abdominal Wall:         Appears nml (cord
                                                                       insert, abd wall)
 Face:                  Orbits nl; profile not Cord Vessels:           Appears normal (3
                        well visualized                                vessel cord)
 Lips:                  Appears normal         Kidneys:                Appear normal
 Palate:                Not well visualized    Bladder:                Appears normal
 Thoracic:              Appears normal         Spine:                  Appears normal
 Heart:                 Not well visualized:   Upper Extremities:      Appears normal
                        EIF in LV
 RVOT:                  Not well visualized    Lower Extremities:      Appears normal

 Other:  Fetus appears to be female. Heels/feet and open hands/5th digits
         visualized. Technically difficult due to fetal position.
Cervix Uterus Adnexa

 Cervix
 Length:           3.34  cm.
 Normal appearance by transabdominal scan.

 Uterus
 No abnormality visualized.

 Right Ovary
 Within normal limits.

 Left Ovary
 Within normal limits.

 Cul De Sac
 No free fluid seen.

 Adnexa
 No abnormality visualized.
Comments

 This patient was seen for a detailed fetal anatomy scan.
 She denies any significant past medical history and denies
 any problems in her current pregnancy.
 She had a cell free DNA test earlier in her pregnancy which
 indicated a low risk for trisomy 21, 18, and 13. A female fetus
 is predicted.
 She was informed that the fetal growth and amniotic fluid
 level were appropriate for her gestational age.
 On today's exam, an intracardiac echogenic focus was noted
 in the left ventricle of the fetal heart.  The small association
 between an echogenic focus and Down syndrome was
 discussed. Due to the echogenic focus noted today, the
 patient was offered and declined an amniocentesis today for
 definitive diagnosis of fetal aneuploidy.  She reports that she
 is comfortable with her negative cell free DNA test.
 The patient was informed that anomalies may be missed due
 to technical limitations. If the fetus is in a suboptimal position
 or maternal habitus is increased, visualization of the fetus in
 the maternal uterus may be impaired.
 A follow-up exam was scheduled in 4 weeks to reassess the
 views of the fetal anatomy and to follow the fetal growth.

## 2022-04-20 DIAGNOSIS — A562 Chlamydial infection of genitourinary tract, unspecified: Secondary | ICD-10-CM | POA: Diagnosis not present

## 2022-04-20 DIAGNOSIS — N730 Acute parametritis and pelvic cellulitis: Secondary | ICD-10-CM | POA: Diagnosis not present

## 2022-05-10 IMAGING — DX DG CHEST 1V PORT
1 series · 1 of 1 positions shown · non-contrast
Comparison: No priors.

CLINICAL DATA: 19-year-old female with history of fever, chest
congestion and headache.

EXAM:
PORTABLE CHEST 1 VIEW

[chest ap]
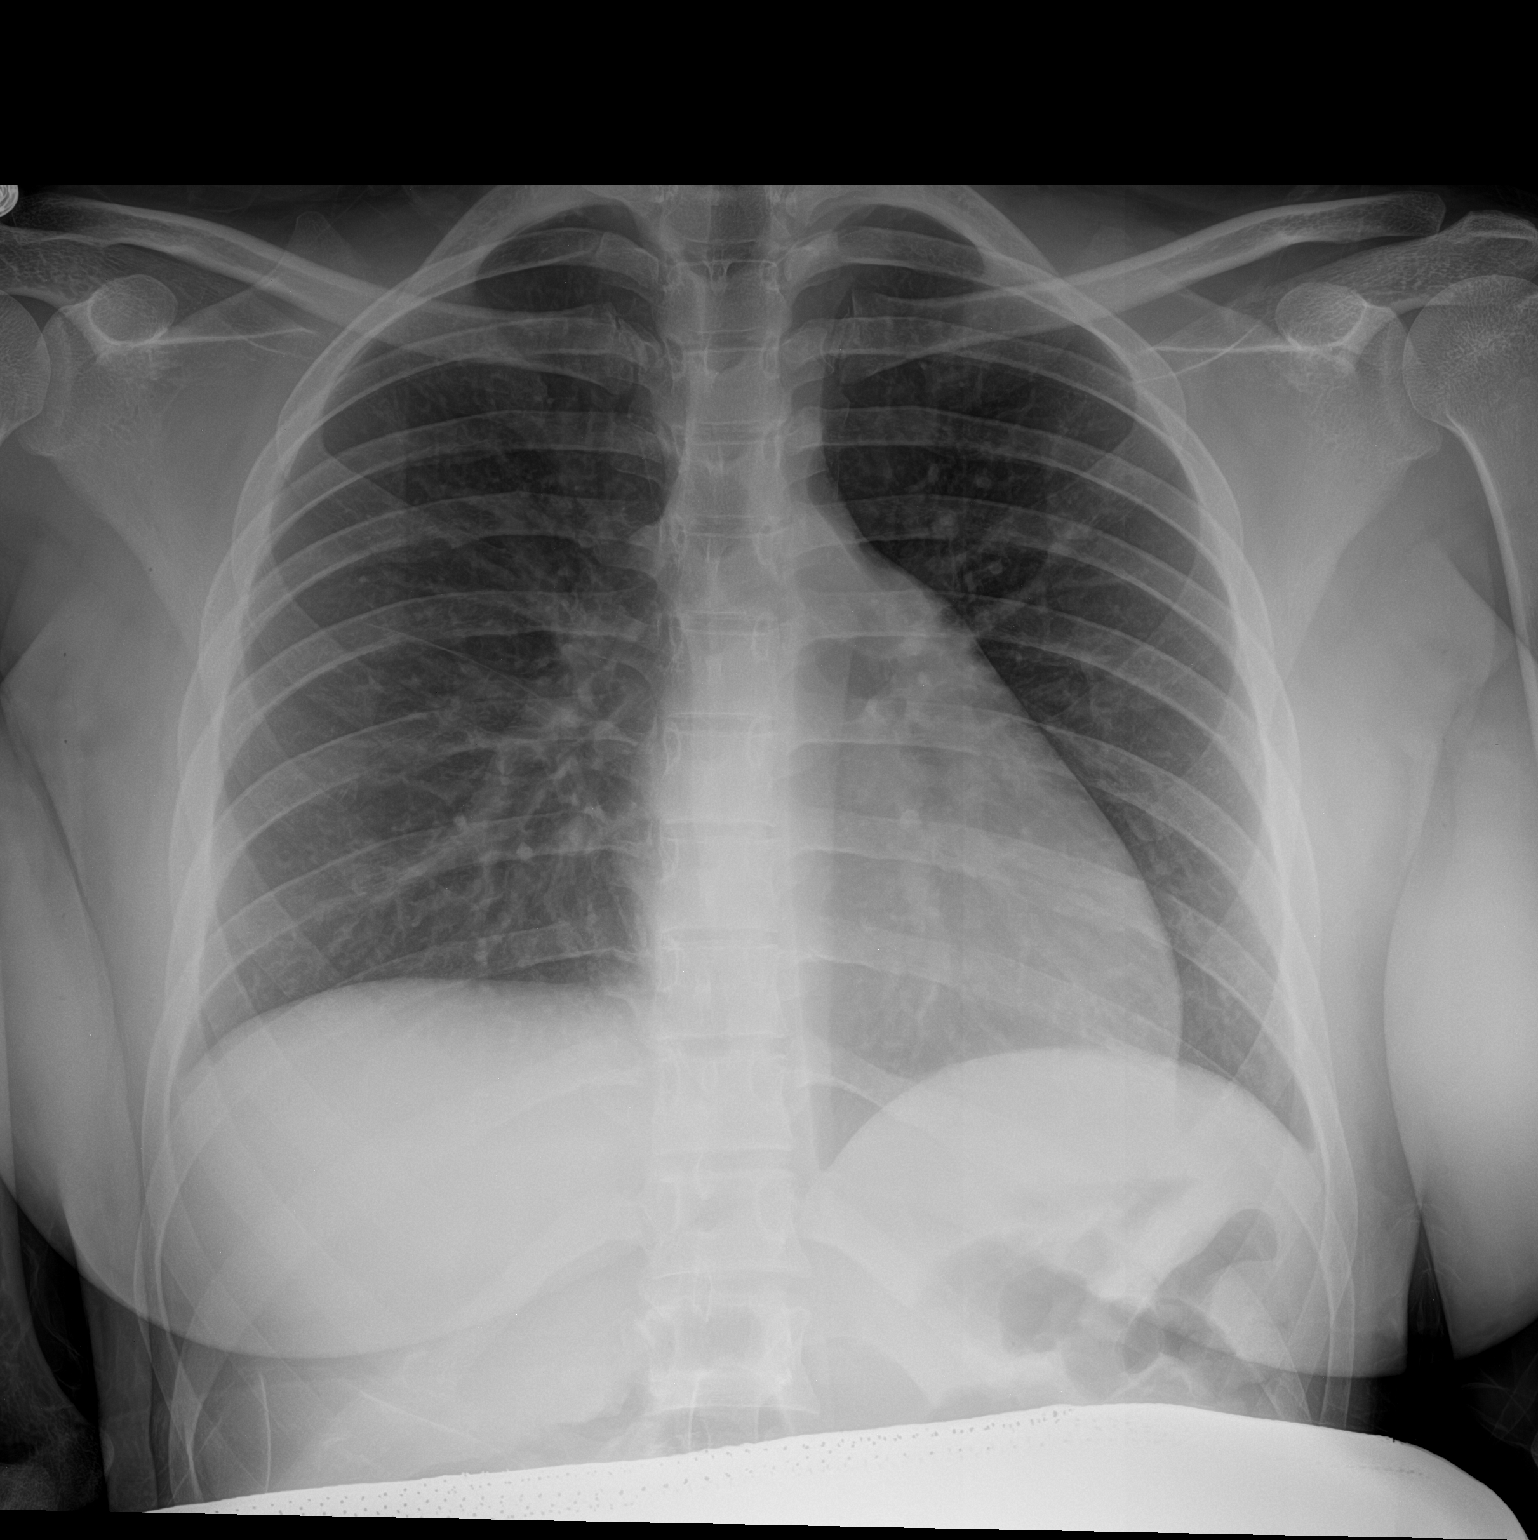

[1 of 1 positions shown; findings below may reference images not displayed]

FINDINGS: Lung volumes are normal. No consolidative airspace disease. No
pleural effusions. No pneumothorax. No pulmonary nodule or mass
noted. Pulmonary vasculature and the cardiomediastinal silhouette
are within normal limits.
IMPRESSION: No radiographic evidence of acute cardiopulmonary disease.

## 2022-05-11 DIAGNOSIS — Z3490 Encounter for supervision of normal pregnancy, unspecified, unspecified trimester: Secondary | ICD-10-CM | POA: Diagnosis not present

## 2022-05-11 DIAGNOSIS — Z3201 Encounter for pregnancy test, result positive: Secondary | ICD-10-CM | POA: Diagnosis not present

## 2022-05-14 DIAGNOSIS — Z419 Encounter for procedure for purposes other than remedying health state, unspecified: Secondary | ICD-10-CM | POA: Diagnosis not present

## 2022-06-14 DIAGNOSIS — Z419 Encounter for procedure for purposes other than remedying health state, unspecified: Secondary | ICD-10-CM | POA: Diagnosis not present

## 2022-07-06 DIAGNOSIS — G43909 Migraine, unspecified, not intractable, without status migrainosus: Secondary | ICD-10-CM | POA: Diagnosis not present

## 2022-07-06 DIAGNOSIS — R110 Nausea: Secondary | ICD-10-CM | POA: Diagnosis not present

## 2022-07-07 DIAGNOSIS — Z833 Family history of diabetes mellitus: Secondary | ICD-10-CM | POA: Diagnosis not present

## 2022-07-07 DIAGNOSIS — B9689 Other specified bacterial agents as the cause of diseases classified elsewhere: Secondary | ICD-10-CM | POA: Diagnosis not present

## 2022-07-07 DIAGNOSIS — G43509 Persistent migraine aura without cerebral infarction, not intractable, without status migrainosus: Secondary | ICD-10-CM | POA: Diagnosis not present

## 2022-07-07 DIAGNOSIS — Z8249 Family history of ischemic heart disease and other diseases of the circulatory system: Secondary | ICD-10-CM | POA: Diagnosis not present

## 2022-07-07 DIAGNOSIS — R519 Headache, unspecified: Secondary | ICD-10-CM | POA: Diagnosis not present

## 2022-07-07 DIAGNOSIS — H539 Unspecified visual disturbance: Secondary | ICD-10-CM | POA: Diagnosis not present

## 2022-07-07 DIAGNOSIS — N760 Acute vaginitis: Secondary | ICD-10-CM | POA: Diagnosis not present

## 2022-07-15 DIAGNOSIS — Z419 Encounter for procedure for purposes other than remedying health state, unspecified: Secondary | ICD-10-CM | POA: Diagnosis not present

## 2022-07-18 DIAGNOSIS — R55 Syncope and collapse: Secondary | ICD-10-CM | POA: Diagnosis not present

## 2022-07-18 DIAGNOSIS — O26891 Other specified pregnancy related conditions, first trimester: Secondary | ICD-10-CM | POA: Diagnosis not present

## 2022-07-18 DIAGNOSIS — Z3A01 Less than 8 weeks gestation of pregnancy: Secondary | ICD-10-CM | POA: Diagnosis not present

## 2022-07-18 DIAGNOSIS — E162 Hypoglycemia, unspecified: Secondary | ICD-10-CM | POA: Diagnosis not present

## 2022-07-18 DIAGNOSIS — O3481 Maternal care for other abnormalities of pelvic organs, first trimester: Secondary | ICD-10-CM | POA: Diagnosis not present

## 2022-07-18 DIAGNOSIS — O99281 Endocrine, nutritional and metabolic diseases complicating pregnancy, first trimester: Secondary | ICD-10-CM | POA: Diagnosis not present

## 2022-08-05 DIAGNOSIS — O2391 Unspecified genitourinary tract infection in pregnancy, first trimester: Secondary | ICD-10-CM | POA: Diagnosis not present

## 2022-08-05 DIAGNOSIS — Z3A01 Less than 8 weeks gestation of pregnancy: Secondary | ICD-10-CM | POA: Diagnosis not present

## 2022-08-05 DIAGNOSIS — R8271 Bacteriuria: Secondary | ICD-10-CM | POA: Diagnosis not present

## 2022-08-05 DIAGNOSIS — O209 Hemorrhage in early pregnancy, unspecified: Secondary | ICD-10-CM | POA: Diagnosis not present

## 2022-08-05 DIAGNOSIS — B9689 Other specified bacterial agents as the cause of diseases classified elsewhere: Secondary | ICD-10-CM | POA: Diagnosis not present

## 2022-08-05 DIAGNOSIS — O23591 Infection of other part of genital tract in pregnancy, first trimester: Secondary | ICD-10-CM | POA: Diagnosis not present

## 2022-08-05 DIAGNOSIS — O26891 Other specified pregnancy related conditions, first trimester: Secondary | ICD-10-CM | POA: Diagnosis not present

## 2022-08-05 DIAGNOSIS — N760 Acute vaginitis: Secondary | ICD-10-CM | POA: Diagnosis not present

## 2022-08-10 DIAGNOSIS — O039 Complete or unspecified spontaneous abortion without complication: Secondary | ICD-10-CM | POA: Diagnosis not present

## 2022-08-10 DIAGNOSIS — Z3491 Encounter for supervision of normal pregnancy, unspecified, first trimester: Secondary | ICD-10-CM | POA: Diagnosis not present

## 2022-08-13 DIAGNOSIS — Z419 Encounter for procedure for purposes other than remedying health state, unspecified: Secondary | ICD-10-CM | POA: Diagnosis not present

## 2022-09-01 DIAGNOSIS — O039 Complete or unspecified spontaneous abortion without complication: Secondary | ICD-10-CM | POA: Diagnosis not present

## 2022-09-13 DIAGNOSIS — Z419 Encounter for procedure for purposes other than remedying health state, unspecified: Secondary | ICD-10-CM | POA: Diagnosis not present

## 2022-10-13 DIAGNOSIS — Z419 Encounter for procedure for purposes other than remedying health state, unspecified: Secondary | ICD-10-CM | POA: Diagnosis not present

## 2022-11-13 DIAGNOSIS — Z419 Encounter for procedure for purposes other than remedying health state, unspecified: Secondary | ICD-10-CM | POA: Diagnosis not present

## 2022-12-13 DIAGNOSIS — Z419 Encounter for procedure for purposes other than remedying health state, unspecified: Secondary | ICD-10-CM | POA: Diagnosis not present

## 2023-01-13 DIAGNOSIS — Z419 Encounter for procedure for purposes other than remedying health state, unspecified: Secondary | ICD-10-CM | POA: Diagnosis not present

## 2023-02-13 DIAGNOSIS — Z419 Encounter for procedure for purposes other than remedying health state, unspecified: Secondary | ICD-10-CM | POA: Diagnosis not present

## 2023-03-15 DIAGNOSIS — Z419 Encounter for procedure for purposes other than remedying health state, unspecified: Secondary | ICD-10-CM | POA: Diagnosis not present

## 2023-04-15 DIAGNOSIS — Z419 Encounter for procedure for purposes other than remedying health state, unspecified: Secondary | ICD-10-CM | POA: Diagnosis not present

## 2023-05-15 DIAGNOSIS — Z419 Encounter for procedure for purposes other than remedying health state, unspecified: Secondary | ICD-10-CM | POA: Diagnosis not present

## 2023-06-15 DIAGNOSIS — Z419 Encounter for procedure for purposes other than remedying health state, unspecified: Secondary | ICD-10-CM | POA: Diagnosis not present

## 2023-07-16 DIAGNOSIS — Z419 Encounter for procedure for purposes other than remedying health state, unspecified: Secondary | ICD-10-CM | POA: Diagnosis not present

## 2023-08-13 DIAGNOSIS — Z419 Encounter for procedure for purposes other than remedying health state, unspecified: Secondary | ICD-10-CM | POA: Diagnosis not present

## 2023-09-22 DIAGNOSIS — B9689 Other specified bacterial agents as the cause of diseases classified elsewhere: Secondary | ICD-10-CM | POA: Diagnosis not present

## 2023-09-22 DIAGNOSIS — O23593 Infection of other part of genital tract in pregnancy, third trimester: Secondary | ICD-10-CM | POA: Diagnosis not present

## 2023-09-22 DIAGNOSIS — Z3A38 38 weeks gestation of pregnancy: Secondary | ICD-10-CM | POA: Diagnosis not present

## 2023-09-24 DIAGNOSIS — Z419 Encounter for procedure for purposes other than remedying health state, unspecified: Secondary | ICD-10-CM | POA: Diagnosis not present

## 2023-10-24 DIAGNOSIS — Z419 Encounter for procedure for purposes other than remedying health state, unspecified: Secondary | ICD-10-CM | POA: Diagnosis not present

## 2023-11-24 DIAGNOSIS — Z419 Encounter for procedure for purposes other than remedying health state, unspecified: Secondary | ICD-10-CM | POA: Diagnosis not present

## 2023-12-24 DIAGNOSIS — Z419 Encounter for procedure for purposes other than remedying health state, unspecified: Secondary | ICD-10-CM | POA: Diagnosis not present

## 2024-01-24 DIAGNOSIS — Z419 Encounter for procedure for purposes other than remedying health state, unspecified: Secondary | ICD-10-CM | POA: Diagnosis not present

## 2024-02-21 DIAGNOSIS — B379 Candidiasis, unspecified: Secondary | ICD-10-CM | POA: Diagnosis not present

## 2024-02-24 DIAGNOSIS — Z419 Encounter for procedure for purposes other than remedying health state, unspecified: Secondary | ICD-10-CM | POA: Diagnosis not present

## 2024-04-25 DIAGNOSIS — Z419 Encounter for procedure for purposes other than remedying health state, unspecified: Secondary | ICD-10-CM | POA: Diagnosis not present
